# Patient Record
Sex: Male | Born: 1970 | Race: White | Hispanic: No | Marital: Married | Smoking: Never smoker
Health system: Southern US, Community
[De-identification: ages and names within clinical notes are randomized; demographics above are authoritative.]

## PROBLEM LIST (undated history)

## (undated) DIAGNOSIS — K219 Gastro-esophageal reflux disease without esophagitis: Secondary | ICD-10-CM

## (undated) DIAGNOSIS — K8020 Calculus of gallbladder without cholecystitis without obstruction: Secondary | ICD-10-CM

## (undated) HISTORY — PX: VASECTOMY: SHX75

## (undated) HISTORY — PX: WISDOM TOOTH EXTRACTION: SHX21

---

## 2009-10-26 ENCOUNTER — Emergency Department: Payer: Self-pay | Admitting: Emergency Medicine

## 2009-11-07 ENCOUNTER — Ambulatory Visit: Payer: Self-pay | Admitting: Family Medicine

## 2014-06-24 DIAGNOSIS — E669 Obesity, unspecified: Secondary | ICD-10-CM | POA: Insufficient documentation

## 2016-04-11 ENCOUNTER — Other Ambulatory Visit: Payer: Self-pay | Admitting: Family Medicine

## 2016-04-12 LAB — CMP12+LP+TP+TSH+6AC+PSA+CBC…
ALBUMIN: 4.5 g/dL (ref 3.5–5.5)
ALK PHOS: 65 IU/L (ref 39–117)
ALT: 40 IU/L (ref 0–44)
AST: 22 IU/L (ref 0–40)
Albumin/Globulin Ratio: 1.6 (ref 1.2–2.2)
BASOS: 0 %
BILIRUBIN TOTAL: 0.9 mg/dL (ref 0.0–1.2)
BUN/Creatinine Ratio: 14 (ref 9–20)
BUN: 13 mg/dL (ref 6–24)
Basophils Absolute: 0 10*3/uL (ref 0.0–0.2)
CHLORIDE: 103 mmol/L (ref 96–106)
Calcium: 9.6 mg/dL (ref 8.7–10.2)
Chol/HDL Ratio: 3.6 ratio units (ref 0.0–5.0)
Cholesterol, Total: 189 mg/dL (ref 100–199)
Creatinine, Ser: 0.91 mg/dL (ref 0.76–1.27)
EOS (ABSOLUTE): 0.3 10*3/uL (ref 0.0–0.4)
Eos: 4 %
Estimated CHD Risk: 0.6 times avg. (ref 0.0–1.0)
FREE THYROXINE INDEX: 2.2 (ref 1.2–4.9)
GFR calc Af Amer: 117 mL/min/{1.73_m2} (ref >59)
GFR calc non Af Amer: 101 mL/min/{1.73_m2} (ref >59)
GGT: 37 IU/L (ref 0–65)
GLOBULIN, TOTAL: 2.9 g/dL (ref 1.5–4.5)
Glucose: 106 mg/dL — ABNORMAL HIGH (ref 65–99)
HDL: 52 mg/dL (ref >39)
HEMATOCRIT: 45.2 % (ref 37.5–51.0)
Hemoglobin: 15.8 g/dL (ref 12.6–17.7)
IMMATURE GRANULOCYTES: 0 %
IRON: 78 ug/dL (ref 38–169)
Immature Grans (Abs): 0 10*3/uL (ref 0.0–0.1)
LDH: 184 IU/L (ref 121–224)
LDL CALC: 111 mg/dL — AB (ref 0–99)
LYMPHS ABS: 2.2 10*3/uL (ref 0.7–3.1)
Lymphs: 34 %
MCH: 30.3 pg (ref 26.6–33.0)
MCHC: 35 g/dL (ref 31.5–35.7)
MCV: 87 fL (ref 79–97)
MONOS ABS: 0.6 10*3/uL (ref 0.1–0.9)
Monocytes: 9 %
NEUTROS ABS: 3.4 10*3/uL (ref 1.4–7.0)
NEUTROS PCT: 53 %
PLATELETS: 291 10*3/uL (ref 150–379)
PROSTATE SPECIFIC AG, SERUM: 0.3 ng/mL (ref 0.0–4.0)
Phosphorus: 3.3 mg/dL (ref 2.5–4.5)
Potassium: 4.2 mmol/L (ref 3.5–5.2)
RBC: 5.21 x10E6/uL (ref 4.14–5.80)
RDW: 14.5 % (ref 12.3–15.4)
SODIUM: 143 mmol/L (ref 134–144)
T3 Uptake Ratio: 28 % (ref 24–39)
T4 TOTAL: 8 ug/dL (ref 4.5–12.0)
TSH: 4.17 u[IU]/mL (ref 0.450–4.500)
Total Protein: 7.4 g/dL (ref 6.0–8.5)
Triglycerides: 130 mg/dL (ref 0–149)
URIC ACID: 6.5 mg/dL (ref 3.7–8.6)
VLDL CHOLESTEROL CAL: 26 mg/dL (ref 5–40)
WBC: 6.4 10*3/uL (ref 3.4–10.8)

## 2016-04-12 LAB — HEPATITIS B SURFACE ANTIBODY,QUALITATIVE: HEP B SURFACE AB, QUAL: NONREACTIVE

## 2016-06-06 ENCOUNTER — Ambulatory Visit: Payer: Self-pay | Admitting: Family

## 2016-06-06 VITALS — BP 145/84 | HR 75 | Temp 98.3°F

## 2016-06-06 DIAGNOSIS — J069 Acute upper respiratory infection, unspecified: Secondary | ICD-10-CM

## 2016-06-06 DIAGNOSIS — J0120 Acute ethmoidal sinusitis, unspecified: Secondary | ICD-10-CM

## 2016-06-06 MED ORDER — PREDNISONE 10 MG (21) PO TBPK: 30.0000 mg | ORAL_TABLET | Freq: Every day | ORAL | 0 refills | Status: DC

## 2016-06-06 MED ORDER — PREDNISONE 10 MG PO TABS: 30.0000 mg | ORAL_TABLET | Freq: Every day | ORAL | 0 refills | Status: DC

## 2016-06-06 MED ORDER — AMOXICILLIN 875 MG PO TABS: 875.0000 mg | ORAL_TABLET | Freq: Two times a day (BID) | ORAL | 0 refills | Status: DC

## 2016-06-06 NOTE — Progress Notes (Signed)
S/ one week hx of  Nasal congestion, ear pain and pressure, lightheaded at times ,  cough , low grade fever ,  Taking muccinex   PMHx - neg Surgical hx vasectomy  O/  Mildly ill appearing  NAD  ENT tms are red ,dull and retracted bilaterally , nasal mucosa very red ,swollen with purulent rhinorhea,  , throat injected and much PND ,  Neck supple nontender heart rsr lungs clear  A/ URI/ sinusitis / cough P supportive measure , amoxicillan , pred pulse x 3 days for ear sx . Per request note for rest at home today and tomorrow. F/u prn not improving.

## 2016-07-11 ENCOUNTER — Ambulatory Visit: Payer: Self-pay | Admitting: Physician Assistant

## 2016-07-11 ENCOUNTER — Encounter: Payer: Self-pay | Admitting: Physician Assistant

## 2016-07-11 VITALS — BP 140/80 | HR 91 | Temp 98.3°F

## 2016-07-11 DIAGNOSIS — L918 Other hypertrophic disorders of the skin: Secondary | ICD-10-CM

## 2016-07-11 NOTE — Progress Notes (Signed)
S: c/o skin tag bleeding in r axilla, has several tags , this one got caught on his shirt and started to bleed, no other complaints  O: vitals wnl, nad, skin tags in r axilla, one very large tag with irritation, n/v intact, cleaned area with betadine, lidocaine local, #11 blade for skin tag, removal, cauterized small area but area still oozing, neosporin and bandage applied, pt tolerated procedure well  A: skin tag removal  P: f/u prn, if bleeding persists return to clinic

## 2016-10-08 ENCOUNTER — Ambulatory Visit
Admission: RE | Admit: 2016-10-08 | Discharge: 2016-10-08 | Disposition: A | Discharge status: Home / Self Care | Payer: Worker's Compensation | Source: Ambulatory Visit | Attending: Specialist | Admitting: Specialist

## 2016-10-08 ENCOUNTER — Other Ambulatory Visit: Payer: Self-pay | Admitting: Specialist

## 2016-10-08 ENCOUNTER — Ambulatory Visit
Admission: RE | Admit: 2016-10-08 | Discharge: 2016-10-08 | Disposition: A | Discharge status: Home / Self Care | Payer: Self-pay | Source: Ambulatory Visit | Attending: Specialist | Admitting: Specialist

## 2016-10-08 DIAGNOSIS — Z0389 Encounter for observation for other suspected diseases and conditions ruled out: Secondary | ICD-10-CM | POA: Insufficient documentation

## 2016-10-08 DIAGNOSIS — T1590XA Foreign body on external eye, part unspecified, unspecified eye, initial encounter: Secondary | ICD-10-CM

## 2017-02-15 ENCOUNTER — Ambulatory Visit: Payer: Self-pay | Admitting: Physician Assistant

## 2017-02-15 ENCOUNTER — Encounter: Payer: Self-pay | Admitting: Physician Assistant

## 2017-02-15 VITALS — BP 116/80 | HR 72 | Temp 97.8°F

## 2017-02-15 DIAGNOSIS — Z0000 Encounter for general adult medical examination without abnormal findings: Secondary | ICD-10-CM

## 2017-02-15 NOTE — Progress Notes (Signed)
S: pt here for wellness physical had biometrics for insurance purposes done at work, was told his good cholesterol is low and he is overweight, has started doing a herbal life diet; no other complaints ros neg. PMH:    Social:  Fam:  O: vitals wnl, nad, ENT wnl, neck supple no lymph, lungs c t a, cv rrr, abd soft nontender bs normal all 4 quads  A: wellness physical  P: return in July for repeat physical and biometrics

## 2017-05-13 ENCOUNTER — Encounter: Payer: Self-pay | Admitting: Physician Assistant

## 2017-05-13 ENCOUNTER — Ambulatory Visit
Admission: RE | Admit: 2017-05-13 | Discharge: 2017-05-13 | Disposition: A | Discharge status: Home / Self Care | Payer: Managed Care, Other (non HMO) | Source: Ambulatory Visit | Attending: Physician Assistant | Admitting: Physician Assistant

## 2017-05-13 ENCOUNTER — Ambulatory Visit: Payer: Self-pay | Admitting: Physician Assistant

## 2017-05-13 VITALS — BP 110/86 | HR 73 | Temp 98.1°F

## 2017-05-13 DIAGNOSIS — R1011 Right upper quadrant pain: Secondary | ICD-10-CM | POA: Insufficient documentation

## 2017-05-13 DIAGNOSIS — K8020 Calculus of gallbladder without cholecystitis without obstruction: Secondary | ICD-10-CM | POA: Diagnosis not present

## 2017-05-13 DIAGNOSIS — K760 Fatty (change of) liver, not elsewhere classified: Secondary | ICD-10-CM | POA: Diagnosis not present

## 2017-05-13 NOTE — Progress Notes (Signed)
S: c/o severe ruq pain for last 3 days, multiple episodes of diarrhea, had black stools yesterday but did take pepto bismol, some fever/chills, no vomiting, but did have nausea on 1st day, has been doing high protein diet for weight loss by using herbal life, strong fam hx of gallbladder disease  O: vitals wnl nad, lungs c t a, cv rrr, abd soft extremely tender in ruq, pt guarding during palpation, bs hyper in both lower quadrants, n/v ntact, pt does not appear toxic  A: acute ruq pain  P: labs, stat US of ruq

## 2017-05-14 LAB — CBC WITH DIFFERENTIAL/PLATELET
BASOS ABS: 0 10*3/uL (ref 0.0–0.2)
Basos: 0 %
EOS (ABSOLUTE): 0.2 10*3/uL (ref 0.0–0.4)
Eos: 3 %
Hematocrit: 49.5 % (ref 37.5–51.0)
Hemoglobin: 16.6 g/dL (ref 13.0–17.7)
Immature Grans (Abs): 0 10*3/uL (ref 0.0–0.1)
Immature Granulocytes: 0 %
LYMPHS ABS: 1.8 10*3/uL (ref 0.7–3.1)
Lymphs: 26 %
MCH: 29 pg (ref 26.6–33.0)
MCHC: 33.5 g/dL (ref 31.5–35.7)
MCV: 86 fL (ref 79–97)
MONOCYTES: 12 %
MONOS ABS: 0.8 10*3/uL (ref 0.1–0.9)
NEUTROS ABS: 4.1 10*3/uL (ref 1.4–7.0)
Neutrophils: 59 %
PLATELETS: 298 10*3/uL (ref 150–379)
RBC: 5.73 x10E6/uL (ref 4.14–5.80)
RDW: 14 % (ref 12.3–15.4)
WBC: 7 10*3/uL (ref 3.4–10.8)

## 2017-05-14 LAB — COMPREHENSIVE METABOLIC PANEL
ALK PHOS: 75 IU/L (ref 39–117)
ALT: 49 IU/L — ABNORMAL HIGH (ref 0–44)
AST: 36 IU/L (ref 0–40)
Albumin/Globulin Ratio: 1.7 (ref 1.2–2.2)
Albumin: 4.7 g/dL (ref 3.5–5.5)
BILIRUBIN TOTAL: 1.4 mg/dL — AB (ref 0.0–1.2)
BUN/Creatinine Ratio: 10 (ref 9–20)
BUN: 10 mg/dL (ref 6–24)
CO2: 19 mmol/L — AB (ref 20–29)
CREATININE: 1.04 mg/dL (ref 0.76–1.27)
Calcium: 10 mg/dL (ref 8.7–10.2)
Chloride: 101 mmol/L (ref 96–106)
GFR calc Af Amer: 99 mL/min/{1.73_m2} (ref >59)
GFR calc non Af Amer: 86 mL/min/{1.73_m2} (ref >59)
GLUCOSE: 100 mg/dL — AB (ref 65–99)
Globulin, Total: 2.8 g/dL (ref 1.5–4.5)
Potassium: 4.7 mmol/L (ref 3.5–5.2)
Sodium: 140 mmol/L (ref 134–144)
Total Protein: 7.5 g/dL (ref 6.0–8.5)

## 2017-05-14 LAB — LIPASE: LIPASE: 30 U/L (ref 13–78)

## 2017-05-14 NOTE — Progress Notes (Signed)
Contacted scheduling dept. Appointment for U/S of Abd. scheduled today @ 11:00 patient was informed.

## 2017-09-24 ENCOUNTER — Encounter: Payer: Self-pay | Admitting: Emergency Medicine

## 2017-09-24 ENCOUNTER — Other Ambulatory Visit: Payer: Self-pay

## 2017-09-24 DIAGNOSIS — K8012 Calculus of gallbladder with acute and chronic cholecystitis without obstruction: Principal | ICD-10-CM | POA: Insufficient documentation

## 2017-09-24 DIAGNOSIS — K219 Gastro-esophageal reflux disease without esophagitis: Secondary | ICD-10-CM | POA: Diagnosis not present

## 2017-09-24 DIAGNOSIS — K819 Cholecystitis, unspecified: Secondary | ICD-10-CM | POA: Diagnosis present

## 2017-09-24 DIAGNOSIS — R945 Abnormal results of liver function studies: Secondary | ICD-10-CM | POA: Diagnosis not present

## 2017-09-24 LAB — CBC
HCT: 47.5 % (ref 40.0–52.0)
HEMOGLOBIN: 16.1 g/dL (ref 13.0–18.0)
MCH: 29.6 pg (ref 26.0–34.0)
MCHC: 33.9 g/dL (ref 32.0–36.0)
MCV: 87.4 fL (ref 80.0–100.0)
Platelets: 288 10*3/uL (ref 150–440)
RBC: 5.44 MIL/uL (ref 4.40–5.90)
RDW: 13.4 % (ref 11.5–14.5)
WBC: 9 10*3/uL (ref 3.8–10.6)

## 2017-09-24 NOTE — ED Triage Notes (Signed)
Patient ambulatory to triage with steady gait, without difficulty or distress noted; pt reports right upper abd pain radiating across upper abd and into back accomp by N/V; st hx of gallbladder issues

## 2017-09-25 ENCOUNTER — Other Ambulatory Visit: Payer: Self-pay

## 2017-09-25 ENCOUNTER — Encounter: Payer: Self-pay | Admitting: Emergency Medicine

## 2017-09-25 ENCOUNTER — Observation Stay
Admission: EM | Admit: 2017-09-25 | Discharge: 2017-09-28 | Disposition: A | Discharge status: Home / Self Care | Payer: Managed Care, Other (non HMO) | Attending: Surgery | Admitting: Surgery

## 2017-09-25 ENCOUNTER — Emergency Department: Payer: Managed Care, Other (non HMO)

## 2017-09-25 DIAGNOSIS — K819 Cholecystitis, unspecified: Secondary | ICD-10-CM

## 2017-09-25 DIAGNOSIS — Z419 Encounter for procedure for purposes other than remedying health state, unspecified: Secondary | ICD-10-CM

## 2017-09-25 HISTORY — DX: Gastro-esophageal reflux disease without esophagitis: K21.9

## 2017-09-25 HISTORY — DX: Calculus of gallbladder without cholecystitis without obstruction: K80.20

## 2017-09-25 LAB — COMPREHENSIVE METABOLIC PANEL
ALBUMIN: 4.2 g/dL (ref 3.5–5.0)
ALBUMIN: 4.5 g/dL (ref 3.5–5.0)
ALK PHOS: 50 U/L (ref 38–126)
ALK PHOS: 61 U/L (ref 38–126)
ALT: 34 U/L (ref 17–63)
ALT: 37 U/L (ref 17–63)
ANION GAP: 6 (ref 5–15)
ANION GAP: 7 (ref 5–15)
AST: 25 U/L (ref 15–41)
AST: 33 U/L (ref 15–41)
BILIRUBIN TOTAL: 1.1 mg/dL (ref 0.3–1.2)
BUN: 13 mg/dL (ref 6–20)
BUN: 16 mg/dL (ref 6–20)
CALCIUM: 8.6 mg/dL — AB (ref 8.9–10.3)
CALCIUM: 9.3 mg/dL (ref 8.9–10.3)
CHLORIDE: 105 mmol/L (ref 101–111)
CO2: 24 mmol/L (ref 22–32)
CO2: 25 mmol/L (ref 22–32)
Chloride: 107 mmol/L (ref 101–111)
Creatinine, Ser: 0.81 mg/dL (ref 0.61–1.24)
Creatinine, Ser: 1 mg/dL (ref 0.61–1.24)
GFR calc Af Amer: >60 mL/min (ref >60)
GFR calc Af Amer: >60 mL/min (ref >60)
GFR calc non Af Amer: >60 mL/min (ref >60)
GFR calc non Af Amer: >60 mL/min (ref >60)
GLUCOSE: 99 mg/dL (ref 65–99)
GLUCOSE: 105 mg/dL — AB (ref 65–99)
POTASSIUM: 3.7 mmol/L (ref 3.5–5.1)
Potassium: 4.2 mmol/L (ref 3.5–5.1)
SODIUM: 137 mmol/L (ref 135–145)
SODIUM: 137 mmol/L (ref 135–145)
TOTAL PROTEIN: 7.1 g/dL (ref 6.5–8.1)
Total Bilirubin: 1.3 mg/dL — ABNORMAL HIGH (ref 0.3–1.2)
Total Protein: 7.9 g/dL (ref 6.5–8.1)

## 2017-09-25 LAB — MRSA PCR SCREENING: MRSA by PCR: NEGATIVE

## 2017-09-25 LAB — URINALYSIS, COMPLETE (UACMP) WITH MICROSCOPIC
BACTERIA UA: NONE SEEN
Bilirubin Urine: NEGATIVE
GLUCOSE, UA: NEGATIVE mg/dL
Hgb urine dipstick: NEGATIVE
KETONES UR: NEGATIVE mg/dL
Leukocytes, UA: NEGATIVE
Nitrite: NEGATIVE
PROTEIN: 30 mg/dL — AB
Specific Gravity, Urine: 1.024 (ref 1.005–1.030)
pH: 5 (ref 5.0–8.0)

## 2017-09-25 LAB — CBC
HCT: 43 % (ref 40.0–52.0)
Hemoglobin: 14.9 g/dL (ref 13.0–18.0)
MCH: 30.4 pg (ref 26.0–34.0)
MCHC: 34.6 g/dL (ref 32.0–36.0)
MCV: 87.9 fL (ref 80.0–100.0)
PLATELETS: 254 10*3/uL (ref 150–440)
RBC: 4.89 MIL/uL (ref 4.40–5.90)
RDW: 13.7 % (ref 11.5–14.5)
WBC: 10.6 10*3/uL (ref 3.8–10.6)

## 2017-09-25 LAB — LIPASE, BLOOD: LIPASE: 34 U/L (ref 11–51)

## 2017-09-25 LAB — TROPONIN I

## 2017-09-25 MED ORDER — ONDANSETRON HCL 4 MG/2ML IJ SOLN
4.0000 mg | Freq: Four times a day (QID) | INTRAMUSCULAR | Status: DC | PRN
  Administered 2017-09-26: 4 mg via INTRAVENOUS
  Filled 2017-09-25: qty 2

## 2017-09-25 MED ORDER — PIPERACILLIN-TAZOBACTAM 3.375 G IVPB
3.3750 g | Freq: Three times a day (TID) | INTRAVENOUS | Status: DC
  Administered 2017-09-25 – 2017-09-26 (×5): 3.375 g via INTRAVENOUS
  Filled 2017-09-25 (×4): qty 50

## 2017-09-25 MED ORDER — PANTOPRAZOLE SODIUM 40 MG IV SOLR
40.0000 mg | Freq: Every day | INTRAVENOUS | Status: DC
  Administered 2017-09-25 – 2017-09-27 (×3): 40 mg via INTRAVENOUS
  Filled 2017-09-25 (×3): qty 40

## 2017-09-25 MED ORDER — MORPHINE SULFATE (PF) 4 MG/ML IV SOLN
4.0000 mg | INTRAVENOUS | Status: DC | PRN
  Administered 2017-09-26: 4 mg via INTRAVENOUS
  Filled 2017-09-25: qty 1

## 2017-09-25 MED ORDER — ONDANSETRON HCL 4 MG/2ML IJ SOLN
4.0000 mg | INTRAMUSCULAR | Status: AC
  Administered 2017-09-25: 4 mg via INTRAVENOUS
  Filled 2017-09-25: qty 2

## 2017-09-25 MED ORDER — SODIUM CHLORIDE 0.9 % IV BOLUS (SEPSIS)
1000.0000 mL | INTRAVENOUS | Status: AC
  Administered 2017-09-25: 1000 mL via INTRAVENOUS

## 2017-09-25 MED ORDER — ENOXAPARIN SODIUM 40 MG/0.4ML ~~LOC~~ SOLN
40.0000 mg | SUBCUTANEOUS | Status: DC
  Administered 2017-09-26 – 2017-09-28 (×3): 40 mg via SUBCUTANEOUS
  Filled 2017-09-25 (×3): qty 0.4

## 2017-09-25 MED ORDER — FENTANYL CITRATE (PF) 250 MCG/5ML IJ SOLN
INTRAMUSCULAR | Status: AC
  Filled 2017-09-25: qty 5

## 2017-09-25 MED ORDER — MORPHINE SULFATE (PF) 4 MG/ML IV SOLN
4.0000 mg | Freq: Once | INTRAVENOUS | Status: AC
  Administered 2017-09-25: 4 mg via INTRAVENOUS
  Filled 2017-09-25: qty 1

## 2017-09-25 MED ORDER — LACTATED RINGERS IV SOLN
INTRAVENOUS | Status: DC
  Administered 2017-09-25 – 2017-09-28 (×7): via INTRAVENOUS

## 2017-09-25 MED ORDER — SODIUM CHLORIDE 0.9 % IV BOLUS (SEPSIS): 1000.0000 mL | INTRAVENOUS | Status: AC

## 2017-09-25 MED ORDER — MIDAZOLAM HCL 2 MG/2ML IJ SOLN
INTRAMUSCULAR | Status: AC
  Filled 2017-09-25: qty 2

## 2017-09-25 MED ORDER — ONDANSETRON 4 MG PO TBDP
4.0000 mg | ORAL_TABLET | Freq: Four times a day (QID) | ORAL | Status: DC | PRN
Start: 2017-09-25 — End: 2017-09-28

## 2017-09-25 MED ORDER — KETOROLAC TROMETHAMINE 30 MG/ML IJ SOLN
30.0000 mg | Freq: Four times a day (QID) | INTRAMUSCULAR | Status: DC | PRN
  Administered 2017-09-25 – 2017-09-27 (×5): 30 mg via INTRAVENOUS
  Filled 2017-09-25 (×5): qty 1

## 2017-09-25 NOTE — ED Provider Notes (Signed)
[facility] Emergency Department Provider Note  ____________________________________________   First MD Initiated Contact with Patient 09/25/17 0255     (approximate)  I have reviewed the triage vital signs and the nursing notes.   HISTORY  Chief Complaint Abdominal Pain    HPI Jerry Johns is a 46 y.o. male with a known history of gallstones who presents for evaluation of acute onset severe pain in his epigastrium and right upper quadrant similar but worse than prior episodes of biliary colic.  He reports that he was diagnosed by ultrasound at least 6 months ago and he is used to intermittent episodes of pain and occasionally of nausea after he eats.  He has intentionally lost more than 30 pounds and has been feeling more healthy than before but continues to have biliary colic from time to time.  He states that he awoke this morning feeling uncomfortable in the epigastrium and right upper quadrant and the pain gradually got worse over the course of the day.  It is the worst she has ever experienced, severe, sharp and stabbing as well as aching and radiating from the front to the back.  He cannot find a position of comfort.  He has vomited numerous times and is not been able to eat or drink anything today.  He has felt subjectively febrile and had some chills but he has not had a fever when he has checked with a thermometer.  He denies chest pain, dysuria, diarrhea, and constipation.  Past Medical History:  Diagnosis Date  . Gallstones summer 2018   diagnosed, but as of Dec 2018, no surgical intervention    There are no active problems to display for this patient.   Past Surgical History:  Procedure Laterality Date  . VASECTOMY      Prior to Admission medications   Medication Sig Start Date End Date Taking? Authorizing Provider  ibuprofen (ADVIL,MOTRIN) 800 MG tablet Take 800 mg by mouth every 6 (six) hours as needed for fever or moderate pain.   07/31/12  Yes [provider]  amoxicillin (AMOXIL) 875 MG tablet Take 1 tablet (875 mg total) by mouth 2 (two) times daily. Patient not taking: Reported on 07/11/2016 06/06/16   Francesco SorMoore, Tommie Anne, NP  predniSONE (DELTASONE) 10 MG tablet Take 3 tablets (30 mg total) by mouth daily with breakfast. Patient not taking: Reported on 07/11/2016 06/06/16   Francesco SorMoore, Tommie Anne, NP    Allergies Patient has no known allergies.  Family History  Problem Relation Age of Onset  . Diabetes Father   . Cancer Father        esophageal  . Diabetes Maternal Grandmother   . Diabetes Maternal Grandfather   . Diabetes Paternal Grandmother   . Diabetes Paternal Grandfather     Social History Social History   Tobacco Use  . Smoking status: Never Smoker  . Smokeless tobacco: Never Used  Substance Use Topics  . Alcohol use: Yes    Alcohol/week: 1.2 oz    Types: 2 Cans of beer per week  . Drug use: No    Review of Systems Constitutional: Subjective fever/chills Cardiovascular: Denies chest pain. Respiratory: Denies shortness of breath. Gastrointestinal: Severe epigastric and right upper quadrant pain that radiates to the back with associated nausea and multiple episodes of emesis Genitourinary: Negative for dysuria. Musculoskeletal: Pain radiating from his epigastrium to his back, no other musculoskeletal issues Integumentary: Negative for rash. Neurological: Negative for headaches, focal weakness or numbness.   ____________________________________________  PHYSICAL EXAM:  VITAL SIGNS: ED Triage Vitals  Enc Vitals Group     BP 09/24/17 2327 (!) 153/88     Pulse Rate 09/24/17 2327 60     Resp 09/24/17 2327 20     Temp 09/24/17 2327 98.4 F (36.9 C)     Temp Source 09/24/17 2327 Oral     SpO2 09/24/17 2327 98 %     Weight 09/24/17 2326 117.9 kg (260 lb)     Height 09/24/17 2326 1.778 m (5\' 10" )     Head Circumference --      Peak Flow --      Pain Score 09/24/17 2325 6      Pain Loc --      Pain Edu? --      Excl. in GC? --     Constitutional: Alert and oriented.  No acute distress but does appear uncomfortable Eyes: Conjunctivae are normal.  Cardiovascular: Normal rate, regular rhythm. Good peripheral circulation. Grossly normal heart sounds. Respiratory: Normal respiratory effort.  No retractions. Lungs CTAB. Gastrointestinal: Soft with severe epigastric and right upper quadrant tenderness to palpation with guarding and positive Murphy sign.  Patient guards somewhat with lower abdominal tenderness but he has no focal tenderness to the lower abdomen. Musculoskeletal: No lower extremity tenderness nor edema. No gross deformities of extremities. Neurologic:  Normal speech and language. No gross focal neurologic deficits are appreciated.  Skin:  Skin is warm, dry and intact. No rash noted. Psychiatric: Mood and affect are normal. Speech and behavior are normal.  ____________________________________________   LABS (all labs ordered are listed, but only abnormal results are displayed)  Labs Reviewed  COMPREHENSIVE METABOLIC PANEL - Abnormal; Notable for the following components:      Result Value   Glucose, Bld 105 (*)    Total Bilirubin 1.3 (*)    All other components within normal limits  URINALYSIS, COMPLETE (UACMP) WITH MICROSCOPIC - Abnormal; Notable for the following components:   Color, Urine YELLOW (*)    APPearance CLEAR (*)    Protein, ur 30 (*)    Squamous Epithelial / LPF 0-5 (*)    All other components within normal limits  LIPASE, BLOOD  CBC  TROPONIN I   ____________________________________________  EKG  None - EKG not ordered by ED physician ____________________________________________  RADIOLOGY   US Abdomen Limited Ruq  Result Date: 09/25/2017 CLINICAL DATA:  Acute onset of severe epigastric and right upper quadrant abdominal pain. EXAM: ULTRASOUND ABDOMEN LIMITED RIGHT UPPER QUADRANT COMPARISON:  Right upper quadrant  ultrasound performed 05/13/2017 FINDINGS: Gallbladder: A 1.3 cm stone is lodged at the neck of the gallbladder. Mild gallbladder wall thickening is noted. No pericholecystic fluid is seen. A positive ultrasonographic Murphy's sign is elicited. Common bile duct: Diameter: 0.3 cm, within normal limits in caliber. Liver: No focal lesion identified. Within normal limits in parenchymal echogenicity. Portal vein is patent on color Doppler imaging with normal direction of blood flow towards the liver. IMPRESSION: 1.3 cm stone lodged at the neck of the gallbladder. Mild gallbladder wall thickening, with a positive ultrasonographic Murphy's sign. This raises concern for mild acute cholecystitis. Electronically Signed   By: Roanna Raider M.D.   On: 09/25/2017 04:36    ____________________________________________   PROCEDURES  Critical Care performed: No   Procedure(s) performed:   Procedures   ____________________________________________   INITIAL IMPRESSION / ASSESSMENT AND PLAN / ED COURSE  As part of my medical decision making, I reviewed the following data within the  electronic MEDICAL RECORD NUMBER Nursing notes reviewed and incorporated, Labs reviewed , Discussed with admitting physician (Dr. Everlene FarrierPabon) and A consult was requested and obtained from this/these consultant(s) Surgery (Dr. Everlene FarrierPabon)    Differential diagnosis includes, but is not limited to, biliary disease (biliary colic, acute cholecystitis, cholangitis, choledocholithiasis, etc), intrathoracic causes for epigastric abdominal pain including ACS, gastritis, duodenitis, pancreatitis, small bowel or large bowel obstruction, abdominal aortic aneurysm, hernia, and gastritis.  Based on the patient's HPI and medical history, I strongly suspect biliary colic, possibly choledocholithiasis.  I doubt cholecystitis unless it is early given that the patient has normal lab work without any elevation of LFTs and no elevated bilirubin.  His vital signs are  stable and he is afebrile and I do not suspect ascending cholangitis at this time.  I will provide morphine and Zofran and obtain an ultrasound for further evaluation to determine the appropriate disposition.  Clinical Course as of Sep 25 526  Wed Sep 25, 2017  [id number] Ultrasound is suggestive of mild cholecystitis and clinically he certainly has severe biliary colic with a nonmobile stone.  His pain has improved but is severe with palpation.  I discussed the case by phone with Dr. Everlene FarrierPabon with general surgery who will admit the patient.  I updated the patient and his family. US ABDOMEN LIMITED RUQ [CF]    Clinical Course User Index [CF] Loleta RoseForbach, Crispin Vogel, MD    ____________________________________________  FINAL CLINICAL IMPRESSION(S) / ED DIAGNOSES  Final diagnoses:  Cholecystitis     MEDICATIONS GIVEN DURING THIS VISIT:  Medications  sodium chloride 0.9 % bolus 1,000 mL (not administered)  morphine 4 MG/ML injection 4 mg (4 mg Intravenous Given 09/25/17 0333)  ondansetron (ZOFRAN) injection 4 mg (4 mg Intravenous Given 09/25/17 0333)  sodium chloride 0.9 % bolus 1,000 mL (1,000 mLs Intravenous New Bag/Given 09/25/17 [id number])     ED Discharge Orders    None       Note:  This document was prepared using Dragon voice recognition software and may include unintentional dictation errors.    Loleta RoseForbach, Cordero Surette, MD 09/25/17 [phone]

## 2017-09-25 NOTE — Progress Notes (Signed)
Phone and charger returned to patient from ED. Windy Carinaurner,Annah Jasko K, RN7:32 AM 09/25/2017

## 2017-09-25 NOTE — H&P (Signed)
Patient ID: Jerry SawyersJohn L Smet, male   DOB: 12/05/1970, 46 y.o.   MRN: [id number]  HPI Jerry Johns is a 46 y.o. male with a 24-hour history of right upper quadrant pain. He reports the pain is moderate to severe, sharp and now constant for the last few hours. Nonspecific out of eating or aggravating factors. Of note he has had some milder beery colic attacks over the last year or so. He has known days got gallstones for the last 8 months or so. No evidence of biliary obstruction, no fevers or chills no evidence of jaundice. Ultrasound personally reviewed showing evidence of mild thickening of the gallbladder with stone, normal common bile duct. White count and LFTs are normal he stimulates very slightly elevated. Is also stone stuck in the neck of the gallbladder. Is able to perform more than 4 Mets without any shortness of breath or chest pain  HPI  Past Medical History:  Diagnosis Date  . Gallstones summer 2018   diagnosed, but as of Dec 2018, no surgical intervention    Past Surgical History:  Procedure Laterality Date  . VASECTOMY      Family History  Problem Relation Age of Onset  . Diabetes Father   . Cancer Father        esophageal  . Diabetes Maternal Grandmother   . Diabetes Maternal Grandfather   . Diabetes Paternal Grandmother   . Diabetes Paternal Grandfather     Social History Social History   Tobacco Use  . Smoking status: Never Smoker  . Smokeless tobacco: Never Used  Substance Use Topics  . Alcohol use: Yes    Alcohol/week: 1.2 oz    Types: 2 Cans of beer per week  . Drug use: No    No Known Allergies  Current Facility-Administered Medications  Medication Dose Route Frequency Provider Last Rate Last Dose  . enoxaparin (LOVENOX) injection 40 mg  40 mg Subcutaneous Q24H Pabon, Diego F, MD      . ketorolac (TORADOL) 30 MG/ML injection 30 mg  30 mg Intravenous Q6H PRN Pabon, Diego F, MD      . lactated ringers infusion   Intravenous Continuous Pabon, Diego F, MD       . morphine 4 MG/ML injection 4 mg  4 mg Intravenous Q3H PRN Pabon, Diego F, MD      . ondansetron (ZOFRAN-ODT) disintegrating tablet 4 mg  4 mg Oral Q6H PRN Pabon, Diego F, MD       Or  . ondansetron (ZOFRAN) injection 4 mg  4 mg Intravenous Q6H PRN Pabon, Diego F, MD      . pantoprazole (PROTONIX) injection 40 mg  40 mg Intravenous QHS Pabon, Diego F, MD      . piperacillin-tazobactam (ZOSYN) IVPB 3.375 g  3.375 g Intravenous Q8H Pabon, Diego F, MD      . sodium chloride 0.9 % bolus 1,000 mL  1,000 mL Intravenous STAT Pabon, Merri Rayiego F, MD       Current Outpatient Medications  Medication Sig Dispense Refill  . ibuprofen (ADVIL,MOTRIN) 800 MG tablet Take 800 mg by mouth every 6 (six) hours as needed for fever or moderate pain.     Marland Kitchen. amoxicillin (AMOXIL) 875 MG tablet Take 1 tablet (875 mg total) by mouth 2 (two) times daily. (Patient not taking: Reported on 07/11/2016) 20 tablet 0  . predniSONE (DELTASONE) 10 MG tablet Take 3 tablets (30 mg total) by mouth daily with breakfast. (Patient not taking: Reported on 07/11/2016) 9  tablet 0     Review of Systems Full ROS  was asked and was negative except for the information on the HPI  Physical Exam Blood pressure 134/85, pulse 79, temperature 98.4 F (36.9 C), temperature source Oral, resp. rate 18, height 5\' 10"  (1.778 m), weight 117.9 kg (260 lb), SpO2 (!) 81 %. CONSTITUTIONAL: Nad EYES: Pupils are equal, round, and reactive to light, Sclera are non-icteric. EARS, NOSE, MOUTH AND THROAT: The oropharynx is clear. The oral mucosa is pink and moist. Hearing is intact to voice. LYMPH NODES:  Lymph nodes in the neck are normal. RESPIRATORY:  Lungs are clear. There is normal respiratory effort, with equal breath sounds bilaterally, and without pathologic use of accessory muscles. CARDIOVASCULAR: Heart is regular without murmurs, gallops, or rubs. GI: The abdomen is  Soft,TTP RUQ + Murphy. GU: Rectal deferred.   MUSCULOSKELETAL: Normal muscle  strength and tone. No cyanosis or edema.   SKIN: Turgor is good and there are no pathologic skin lesions or ulcers. NEUROLOGIC: Motor and sensation is grossly normal. Cranial nerves are grossly intact. PSYCH:  Oriented to person, place and time. Affect is normal.  Data Reviewed  I have personally reviewed the patient's imaging, laboratory findings and medical records.    Assessment/Plan Acute cholecystitis. Discussed with the patient in detail about his disease process and I do recommend laparoscopic cholecystectomy.The risks, benefits, complications, treatment options, and expected outcomes were discussed with the patient. The possibilities of bleeding, recurrent infection, finding a normal gallbladder, perforation of viscus organs, damage to surrounding structures, bile leak, abscess formation, needing a drain placed, the need for additional procedures, reaction to medication, pulmonary aspiration,  failure to diagnose a condition, the possible need to convert to an open procedure, and creating a complication requiring transfusion or operation were discussed with the patient. The patient and/or family concurred with the proposed plan, giving informed consent.  We'll go ahead and admit him to the hospital place him nothing by mouth give him another liter of crystalloid and start broad-spectrum antibiotics. Dr. Tonita CongWoodham will be performing his cholecystectomy probably later today if not tomorrow, he understands and agrees Sterling Bigiego Pabon, MD [facility] General Surgeon 09/25/2017, 5:47 AM

## 2017-09-25 NOTE — Progress Notes (Signed)
  Patient seen and examined this morning.  Agree with the assessment of Dr. Everlene FarrierPabon.  I discussed the procedure of a laparoscopic cholecystectomy in detail.  We discussed the risks and benefits of a laparoscopic cholecystectomy and possible cholangiogram including, but not limited to bleeding, infection, injury to surrounding structures such as the intestine or liver, bile leak, retained gallstones, need to convert to an open procedure, prolonged diarrhea, blood clots such as  DVT, common bile duct injury, anesthesia risks, and possible need for additional procedures.  The likelihood of improvement in symptoms and return to the patient's normal status is good. We discussed the typical post-operative recovery course.  Patient voiced understanding and desires to proceed.  Discussed that he will be added on to the surgery schedule and we will proceed to the operating room later today.  Jerry Frameharles Shalynn Jorstad, MD [facility] General Surgeon [facility]  Day [facility] [phone](7a-7p) [phone] Night [facility] [phone](7p-7a) [phone]

## 2017-09-25 NOTE — Care Management Note (Signed)
Case Management Note  Patient Details  Name: Jerry SawyersJohn L Johns MRN: [id number] Date of Birth: 07/28/1971  Subjective/Objective:                 Placed in observation for cholecystitis.  Schedule for lap chole today.  At present, No discharge needs identified.    Action/Plan:   Expected Discharge Date:  09/27/17               Expected Discharge Plan:     In-House Referral:     Discharge planning Services     Post Acute Care Choice:    Choice offered to:     DME Arranged:    DME Agency:     HH Arranged:    HH Agency:     Status of Service:     If discussed at [employer] Meetings, dates discussed:    Additional Comments:  Eber HongGreene, Lacey Dotson R, RN 09/25/2017, 9:17 AM

## 2017-09-25 NOTE — Progress Notes (Signed)
Chaplain responded to an OR for advanced directive for a pt in Rm. 226. CH met pt and wife at bedside. CH tried to educated the pt on who to compete AD, but pt was too sleepy this AM. CH left AD material with pt and told pt that he will return to educated pt later when he is awake. Pt agreed.   09/25/17 0900  Clinical Encounter Type  Visited With Patient;Patient and family together  Visit Type Initial;Other (Comment)  Referral From Nurse  Consult/Referral To Chaplain  Spiritual Encounters  Spiritual Needs Literature;Brochure           

## 2017-09-25 NOTE — Plan of Care (Signed)
Pt to OR in am. Pain is controlled and is pt is without nausea.

## 2017-09-26 ENCOUNTER — Observation Stay: Payer: Managed Care, Other (non HMO) | Admitting: Anesthesiology

## 2017-09-26 ENCOUNTER — Observation Stay: Payer: Managed Care, Other (non HMO)

## 2017-09-26 ENCOUNTER — Encounter
Admission: EM | Disposition: A | Discharge status: Home / Self Care | Payer: Self-pay | Source: Home / Self Care | Attending: Emergency Medicine

## 2017-09-26 ENCOUNTER — Encounter: Payer: Self-pay | Admitting: *Deleted

## 2017-09-26 DIAGNOSIS — K819 Cholecystitis, unspecified: Secondary | ICD-10-CM | POA: Diagnosis not present

## 2017-09-26 HISTORY — PX: CHOLECYSTECTOMY: SHX55

## 2017-09-26 LAB — HIV ANTIBODY (ROUTINE TESTING W REFLEX): HIV SCREEN 4TH GENERATION: NONREACTIVE

## 2017-09-26 SURGERY — LAPAROSCOPIC CHOLECYSTECTOMY
Anesthesia: General | Wound class: Clean Contaminated

## 2017-09-26 MED ORDER — SUCCINYLCHOLINE CHLORIDE 20 MG/ML IJ SOLN
INTRAMUSCULAR | Status: DC | PRN
  Administered 2017-09-26: 120 mg via INTRAVENOUS

## 2017-09-26 MED ORDER — FENTANYL CITRATE (PF) 100 MCG/2ML IJ SOLN
INTRAMUSCULAR | Status: AC
  Administered 2017-09-26: 25 ug via INTRAVENOUS
  Filled 2017-09-26: qty 2

## 2017-09-26 MED ORDER — LIDOCAINE HCL (CARDIAC) 20 MG/ML IV SOLN
INTRAVENOUS | Status: DC | PRN
  Administered 2017-09-26: 100 mg via INTRAVENOUS

## 2017-09-26 MED ORDER — FAMOTIDINE 20 MG PO TABS
ORAL_TABLET | ORAL | Status: AC
  Administered 2017-09-26: 20 mg via ORAL
  Filled 2017-09-26: qty 1

## 2017-09-26 MED ORDER — DEXAMETHASONE SODIUM PHOSPHATE 10 MG/ML IJ SOLN
INTRAMUSCULAR | Status: DC | PRN
  Administered 2017-09-26: 4 mg via INTRAVENOUS

## 2017-09-26 MED ORDER — ONDANSETRON HCL 4 MG/2ML IJ SOLN
INTRAMUSCULAR | Status: AC
  Filled 2017-09-26: qty 2

## 2017-09-26 MED ORDER — PROPOFOL 10 MG/ML IV BOLUS
INTRAVENOUS | Status: DC | PRN
  Administered 2017-09-26: 200 mg via INTRAVENOUS

## 2017-09-26 MED ORDER — DEXAMETHASONE SODIUM PHOSPHATE 10 MG/ML IJ SOLN
INTRAMUSCULAR | Status: AC
  Filled 2017-09-26: qty 1

## 2017-09-26 MED ORDER — LIDOCAINE HCL (PF) 2 % IJ SOLN
INTRAMUSCULAR | Status: AC
  Filled 2017-09-26: qty 10

## 2017-09-26 MED ORDER — FAMOTIDINE 20 MG PO TABS
20.0000 mg | ORAL_TABLET | Freq: Once | ORAL | Status: AC
  Administered 2017-09-26: 20 mg via ORAL

## 2017-09-26 MED ORDER — MIDAZOLAM HCL 2 MG/2ML IJ SOLN
INTRAMUSCULAR | Status: DC | PRN
  Administered 2017-09-26: 2 mg via INTRAVENOUS

## 2017-09-26 MED ORDER — SUGAMMADEX SODIUM 500 MG/5ML IV SOLN
INTRAVENOUS | Status: AC
  Filled 2017-09-26: qty 5

## 2017-09-26 MED ORDER — SODIUM CHLORIDE 0.9 % IJ SOLN
INTRAMUSCULAR | Status: AC
  Filled 2017-09-26: qty 50

## 2017-09-26 MED ORDER — KETOROLAC TROMETHAMINE 30 MG/ML IJ SOLN
INTRAMUSCULAR | Status: AC
  Filled 2017-09-26: qty 1

## 2017-09-26 MED ORDER — MIDAZOLAM HCL 2 MG/2ML IJ SOLN
INTRAMUSCULAR | Status: AC
  Filled 2017-09-26: qty 2

## 2017-09-26 MED ORDER — IOTHALAMATE MEGLUMINE 60 % INJ SOLN
INTRAMUSCULAR | Status: DC | PRN
  Administered 2017-09-26: 40 mL

## 2017-09-26 MED ORDER — ONDANSETRON HCL 4 MG/2ML IJ SOLN
4.0000 mg | Freq: Once | INTRAMUSCULAR | Status: AC | PRN
  Administered 2017-09-26: 4 mg via INTRAVENOUS

## 2017-09-26 MED ORDER — SUCCINYLCHOLINE CHLORIDE 20 MG/ML IJ SOLN
INTRAMUSCULAR | Status: AC
  Filled 2017-09-26: qty 1

## 2017-09-26 MED ORDER — FENTANYL CITRATE (PF) 100 MCG/2ML IJ SOLN
25.0000 ug | INTRAMUSCULAR | Status: DC | PRN
  Administered 2017-09-26 (×3): 25 ug via INTRAVENOUS

## 2017-09-26 MED ORDER — ONDANSETRON HCL 4 MG/2ML IJ SOLN
INTRAMUSCULAR | Status: DC | PRN
  Administered 2017-09-26: 4 mg via INTRAVENOUS

## 2017-09-26 MED ORDER — FENTANYL CITRATE (PF) 100 MCG/2ML IJ SOLN
INTRAMUSCULAR | Status: AC
  Filled 2017-09-26: qty 2

## 2017-09-26 MED ORDER — SUGAMMADEX SODIUM 200 MG/2ML IV SOLN
INTRAVENOUS | Status: DC | PRN
  Administered 2017-09-26: 235 mg via INTRAVENOUS

## 2017-09-26 MED ORDER — OXYCODONE-ACETAMINOPHEN 5-325 MG PO TABS
1.0000 | ORAL_TABLET | Freq: Four times a day (QID) | ORAL | Status: DC | PRN
  Administered 2017-09-26 – 2017-09-28 (×3): 2 via ORAL
  Filled 2017-09-26 (×3): qty 2

## 2017-09-26 MED ORDER — PIPERACILLIN-TAZOBACTAM 3.375 G IVPB
INTRAVENOUS | Status: AC
  Filled 2017-09-26: qty 50

## 2017-09-26 MED ORDER — ROCURONIUM BROMIDE 100 MG/10ML IV SOLN
INTRAVENOUS | Status: DC | PRN
  Administered 2017-09-26: 10 mg via INTRAVENOUS
  Administered 2017-09-26: 5 mg via INTRAVENOUS
  Administered 2017-09-26: 45 mg via INTRAVENOUS

## 2017-09-26 MED ORDER — SODIUM CHLORIDE FLUSH 0.9 % IV SOLN
INTRAVENOUS | Status: AC
  Administered 2017-09-26: 17:00:00
  Filled 2017-09-26: qty 10

## 2017-09-26 MED ORDER — FENTANYL CITRATE (PF) 100 MCG/2ML IJ SOLN
INTRAMUSCULAR | Status: DC | PRN
  Administered 2017-09-26: 50 ug via INTRAVENOUS
  Administered 2017-09-26: 100 ug via INTRAVENOUS
  Administered 2017-09-26: 50 ug via INTRAVENOUS

## 2017-09-26 MED ORDER — PROPOFOL 10 MG/ML IV BOLUS
INTRAVENOUS | Status: AC
  Filled 2017-09-26: qty 20

## 2017-09-26 MED ORDER — ACETAMINOPHEN 10 MG/ML IV SOLN
INTRAVENOUS | Status: DC | PRN
  Administered 2017-09-26: 1000 mg via INTRAVENOUS

## 2017-09-26 MED ORDER — LIDOCAINE HCL 1 % IJ SOLN
INTRAMUSCULAR | Status: DC | PRN
  Administered 2017-09-26: 30 mL

## 2017-09-26 MED ORDER — BUPIVACAINE HCL (PF) 0.5 % IJ SOLN
INTRAMUSCULAR | Status: AC
  Filled 2017-09-26: qty 30

## 2017-09-26 MED ORDER — ROCURONIUM BROMIDE 50 MG/5ML IV SOLN
INTRAVENOUS | Status: AC
  Filled 2017-09-26: qty 1

## 2017-09-26 MED ORDER — LIDOCAINE HCL (PF) 1 % IJ SOLN
INTRAMUSCULAR | Status: AC
  Filled 2017-09-26: qty 30

## 2017-09-26 MED ORDER — ACETAMINOPHEN 10 MG/ML IV SOLN
INTRAVENOUS | Status: AC
  Filled 2017-09-26: qty 100

## 2017-09-26 SURGICAL SUPPLY — 43 items
ADHESIVE MASTISOL STRL (MISCELLANEOUS) ×2 IMPLANT
APPLIER CLIP ROT 10 11.4 M/L (STAPLE) ×2
BLADE SURG SZ11 CARB STEEL (BLADE) ×2 IMPLANT
CANISTER SUCT 1200ML W/VALVE (MISCELLANEOUS) ×2 IMPLANT
CATH CHOLANG 76X19 KUMAR (CATHETERS) ×2 IMPLANT
CHLORAPREP W/TINT 26ML (MISCELLANEOUS) ×2 IMPLANT
CLIP APPLIE ROT 10 11.4 M/L (STAPLE) ×1 IMPLANT
CONRAY 60ML FOR OR (MISCELLANEOUS) IMPLANT
DECANTER SPIKE VIAL GLASS SM (MISCELLANEOUS) ×4 IMPLANT
DEFOGGER SCOPE WARMER CLEARIFY (MISCELLANEOUS) ×2 IMPLANT
DRAPE SHEET LG 3/4 BI-LAMINATE (DRAPES) ×2 IMPLANT
DRSG TEGADERM 2-3/8X2-3/4 SM (GAUZE/BANDAGES/DRESSINGS) ×8 IMPLANT
DRSG TELFA 4X3 1S NADH ST (GAUZE/BANDAGES/DRESSINGS) ×2 IMPLANT
ELECT REM PT RETURN 9FT ADLT (ELECTROSURGICAL) ×2
ELECTRODE REM PT RTRN 9FT ADLT (ELECTROSURGICAL) ×1 IMPLANT
GLOVE BIO SURGEON STRL SZ7.5 (GLOVE) ×2 IMPLANT
GLOVE INDICATOR 8.0 STRL GRN (GLOVE) ×2 IMPLANT
GOWN STRL REUS W/ TWL LRG LVL3 (GOWN DISPOSABLE) ×3 IMPLANT
GOWN STRL REUS W/TWL LRG LVL3 (GOWN DISPOSABLE) ×3
GRASPER SUT TROCAR 14GX15 (MISCELLANEOUS) IMPLANT
IRRIGATION STRYKERFLOW (MISCELLANEOUS) ×1 IMPLANT
IRRIGATOR STRYKERFLOW (MISCELLANEOUS) ×2
IV NS 1000ML (IV SOLUTION)
IV NS 1000ML BAXH (IV SOLUTION) IMPLANT
L-HOOK LAP DISP 36CM (ELECTROSURGICAL) ×2
LABEL OR SOLS (LABEL) ×2 IMPLANT
LHOOK LAP DISP 36CM (ELECTROSURGICAL) ×1 IMPLANT
NEEDLE HYPO 25X1 1.5 SAFETY (NEEDLE) ×2 IMPLANT
NEEDLE VERESS 14GA 120MM (NEEDLE) ×2 IMPLANT
NS IRRIG 500ML POUR BTL (IV SOLUTION) ×2 IMPLANT
PACK LAP CHOLECYSTECTOMY (MISCELLANEOUS) ×2 IMPLANT
PENCIL ELECTRO HAND CTR (MISCELLANEOUS) ×2 IMPLANT
POUCH ENDO CATCH 10MM SPEC (MISCELLANEOUS) ×2 IMPLANT
SCISSORS METZENBAUM CVD 33 (INSTRUMENTS) ×2 IMPLANT
SLEEVE ENDOPATH XCEL 5M (ENDOMECHANICALS) ×4 IMPLANT
STRIP CLOSURE SKIN 1/2X4 (GAUZE/BANDAGES/DRESSINGS) ×2 IMPLANT
SUT MNCRL 4-0 (SUTURE) ×1
SUT MNCRL 4-0 27XMFL (SUTURE) ×1
SUT VICRYL 0 AB UR-6 (SUTURE) IMPLANT
SUTURE MNCRL 4-0 27XMF (SUTURE) ×1 IMPLANT
TROCAR XCEL 12X100 BLDLESS (ENDOMECHANICALS) ×2 IMPLANT
TROCAR XCEL NON-BLD 5MMX100MML (ENDOMECHANICALS) ×2 IMPLANT
TUBING INSUFFLATION (TUBING) ×2 IMPLANT

## 2017-09-26 NOTE — Anesthesia Preprocedure Evaluation (Signed)
Anesthesia Evaluation  Patient identified by MRN, date of birth, ID band Patient awake    Reviewed: Allergy & Precautions, H&P , NPO status , Patient's Chart, lab work & pertinent test results, reviewed documented beta blocker date and time   History of Anesthesia Complications Negative for: history of anesthetic complications  Airway Mallampati: I  TM Distance: >3 FB Neck ROM: full    Dental  (+) Dental Advidsory Given, Teeth Intact   Pulmonary neg pulmonary ROS,    breath sounds clear to auscultation       Cardiovascular Exercise Tolerance: Good negative cardio ROS       Neuro/Psych negative neurological ROS  negative psych ROS   GI/Hepatic Neg liver ROS, GERD  ,  Endo/Other  negative endocrine ROS  Renal/GU negative Renal ROS  negative genitourinary   Musculoskeletal   Abdominal   Peds  Hematology negative hematology ROS (+)   Anesthesia Other Findings Past Medical History: summer 2018: Gallstones     Comment:  diagnosed, but as of Dec 2018, no surgical intervention No date: GERD (gastroesophageal reflux disease)   Reproductive/Obstetrics negative OB ROS                             Anesthesia Physical Anesthesia Plan  ASA: II  Anesthesia Plan: General   Post-op Pain Management:    Induction: Intravenous  PONV Risk Score and Plan: 2 and Ondansetron and Dexamethasone  Airway Management Planned: Oral ETT  Additional Equipment:   Intra-op Plan:   Post-operative Plan: Extubation in OR  Informed Consent: I have reviewed the patients History and Physical, chart, labs and discussed the procedure including the risks, benefits and alternatives for the proposed anesthesia with the patient or authorized representative who has indicated his/her understanding and acceptance.   Dental Advisory Given  Plan Discussed with: Anesthesiologist, CRNA and Surgeon  Anesthesia Plan  Comments:         Anesthesia Quick Evaluation

## 2017-09-26 NOTE — Anesthesia Procedure Notes (Signed)
Procedure Name: Intubation Performed by: Lance Muss, CRNA Pre-anesthesia Checklist: Patient identified, Patient being monitored, Timeout performed, Emergency Drugs available and Suction available Patient Re-evaluated:Patient Re-evaluated prior to induction Oxygen Delivery Method: Circle system utilized Preoxygenation: Pre-oxygenation with 100% oxygen Induction Type: IV induction Ventilation: Mask ventilation without difficulty and Oral airway inserted - appropriate to patient size Laryngoscope Size: Mac and 3 Grade View: Grade I Tube type: Oral Tube size: 7.5 mm Number of attempts: 1 Airway Equipment and Method: Stylet Placement Confirmation: ETT inserted through vocal cords under direct vision,  positive ETCO2 and breath sounds checked- equal and bilateral Secured at: 24 cm Tube secured with: Tape Dental Injury: Teeth and Oropharynx as per pre-operative assessment

## 2017-09-26 NOTE — Plan of Care (Signed)
Pt went for lap choley today. Clear diet at this time and advance as tolerated

## 2017-09-26 NOTE — Anesthesia Post-op Follow-up Note (Signed)
Anesthesia QCDR form completed.        

## 2017-09-26 NOTE — Op Note (Signed)
Laparoscopic Cholecystectomy  Pre-operative Diagnosis: Acute cholecystitis  Post-operative Diagnosis: Same  Procedure: Laparoscopic cholecystectomy with attempted intraoperative cholangiogram  Surgeon: Brandie Lopes T. Oswin Johal, MD FACS  Anesthesia: Gen. with endotracheal tube  Assistant: None  Procedure Details  The patient was seen again in the Holding Room. The benefits, complications, treatment options, and expected outcomes were discussed with the patient. The risks of bleeding, infection, recurrence of symptoms, failure to resolve symptoms, bile duct damage, bile duct leak, retained common bile duct stone, bowel injury, any of which could require further surgery and/or ERCP, stent, or papillotomy were reviewed with the patient. The likelihood of improving the patient's symptoms with return to their baseline status is good.  The patient and/or family concurred with the proposed plan, giving informed consent.  The patient was taken to Operating Room, identified as Jerry Johns and the procedure verified as Laparoscopic Cholecystectomy.  A Time Out was held and the above information confirmed.  Prior to the induction of general anesthesia, antibiotic prophylaxis was administered. VTE prophylaxis was in place. General endotracheal anesthesia was then administered and tolerated well. After the induction, the abdomen was prepped with Chloraprep and draped in the sterile fashion. The patient was positioned in the supine position.  Local anesthetic  was injected into the skin near the umbilicus and an incision made. The Veress needle was placed. Pneumoperitoneum was then created with C<MEASUR<<MEASUREMEN<MEASUREMENB[facility]>VBerna AlcidCheree [phone], [facility]>VBerna AlcidCheree [phone] A ts were placed in the right upper quadrant and a 12 mm epigastric port was placed all under direct vision. All skin incisions  were  infiltrated with a local anesthetic agent before making the incision and placing the trocars.   The patient was positioned  in reverse Trendelenburg, tilted slightly to the patient's left.  The gallbladder was identified, the fundus grasped and retracted cephalad. Adhesions were lysed bluntly. The infundibulum was grasped and retracted laterally, exposing the peritoneum overlying the triangle of Calot. This was then divided and exposed in a blunt fashion. A critical view of the cystic duct and cystic artery was obtained.  The cystic duct was clearly identified and bluntly dissected.   In the process of this dissection there was a large volume arterial bleed from the mid body of the gallbladder.  Attempts at controlling it with electrocautery failed.  It was controlled with manual compression while the camera was cleaned after it was sprayed with blood.  The cystic artery was then identified and a single clip was placed upon it just proximal to the area of bleeding which controlled the pulsatile bleeding.  At this point we attempted a cholangiogram using a Kumar catheter and clamped.  The catheter and clamp were placed and bile was returned into the catheter however it was noted that the gallstone that was present on imaging was between the clamp and the cystic duct.  The clamp was opened and the gallstone was milked distal prior to the Kumar clamp being resecured.  Under fluoroscopy contrast was instilled but after 3 attempts it was noted that the contrast was only going into the gallbladder and was found to be trapped within the clamp going into the gallbladder body.  In maneuvering to correct this contrast build into the peritoneal cavity and the cholangiogram was aborted.  The easily identified cystic artery and duct were then serially clipped and cut with endoscopic shears. The gallbladder was  taken from the gallbladder fossa in a retrograde fashion with the electrocautery. The gallbladder was removed  and placed in an Endocatch bag.   The liver bed was irrigated and inspected.  There was additional bleeding noted from the gallbladder bed that initially was not controlled with electrocautery.  A sheet of Surgicel was placed over the area of bleeding and pressure was held.  The abdomen was then copiously irrigated and our attention return to the area of bleeding.  The bleeding was less and this time hemostasis was achieved with the electrocautery. Copious irrigation was utilized and was repeatedly aspirated until clear.  The gallbladder and Endocatch sac were then removed through the epigastric port site.   Inspection of the right upper quadrant was performed. No bleeding, bile duct injury or leak, or bowel injury was noted. Pneumoperitoneum was released.  The epigastric port site was closed with figure-of-eight 0 Vicryl sutures. 4-0 subcuticular Monocryl was used to close the skin. Steristrips and Mastisol and sterile dressings were  applied.  The patient was then extubated and brought to the recovery room in stable condition. Sponge, lap, and needle counts were correct at closure and at the conclusion of the case.   Findings: Acute cholecystitis   Estimated Blood Loss: 200 mL         Drains: None         Specimens: Gallbladder           Complications: none               Chirsty Armistead T. Tonita CongWoodham, MD, FACS

## 2017-09-26 NOTE — Progress Notes (Signed)
CC: Cholecystitis Subjective: Patient reports doing well overnight.  Pain is fully resolved with current therapy.  Surgery was delayed secondary to emergencies in the operating room yesterday.  Objective: Vital signs in last 24 hours: Temp:  [97.4 F (36.3 C)-98.3 F (36.8 C)] 97.7 F (36.5 C) (12/27 0853) Pulse Rate:  [50-65] 54 (12/27 0853) Resp:  [14-18] 14 (12/27 0853) BP: (112-153)/(62-77) 117/70 (12/27 0853) SpO2:  [96 %-99 %] 98 % (12/27 0853) Last BM Date: 09/24/17  Intake/Output from previous day: 12/26 0701 - 12/27 0700 In: 2740 [I.V.:2560; IV Piggyback:180] Out: 300 [Urine:300] Intake/Output this shift: No intake/output data recorded.  Physical exam:  General: No acute distress Chest: Clear to auscultation Heart: Regular rhythm Abdomen: Soft, nontender, nondistended  Lab Results: CBC  Recent Labs    09/24/17 2340 09/25/17 0749  WBC 9.0 10.6  HGB 16.1 14.9  HCT 47.5 43.0  PLT 288 254   BMET Recent Labs    09/24/17 2340 09/25/17 0749  NA 137 137  K 4.2 3.7  CL 105 107  CO2 25 24  GLUCOSE 105* 99  BUN 16 13  CREATININE 1.00 0.81  CALCIUM 9.3 8.6*   PT/INR No results for input(s): LABPROT, INR in the last 72 hours. ABG No results for input(s): PHART, HCO3 in the last 72 hours.  Invalid input(s): PCO2, PO2  Studies/Results: Koreas Abdomen Limited Ruq  Result Date: 09/25/2017 CLINICAL DATA:  Acute onset of severe epigastric and right upper quadrant abdominal pain. EXAM: ULTRASOUND ABDOMEN LIMITED RIGHT UPPER QUADRANT COMPARISON:  Right upper quadrant ultrasound performed 05/13/2017 FINDINGS: Gallbladder: A 1.3 cm stone is lodged at the neck of the gallbladder. Mild gallbladder wall thickening is noted. No pericholecystic fluid is seen. A positive ultrasonographic Murphy's sign is elicited. Common bile duct: Diameter: 0.3 cm, within normal limits in caliber. Liver: No focal lesion identified. Within normal limits in parenchymal echogenicity. Portal  vein is patent on color Doppler imaging with normal direction of blood flow towards the liver. IMPRESSION: 1.3 cm stone lodged at the neck of the gallbladder. Mild gallbladder wall thickening, with a positive ultrasonographic Murphy's sign. This raises concern for mild acute cholecystitis. Electronically Signed   By: Roanna RaiderJeffery  Chang M.D.   On: 09/25/2017 04:36    Anti-infectives: Anti-infectives (From admission, onward)   Start     Dose/Rate Route Frequency Ordered Stop   09/25/17 0600  piperacillin-tazobactam (ZOSYN) IVPB 3.375 g     3.375 g 12.5 mL/hr over 240 Minutes Intravenous Every 8 hours 09/25/17 0547        Assessment/Plan:  46 year old male admitted with acute cholecystitis.  Again discussed the surgery of a laparoscopic cholecystectomy.  He voiced understanding and desires to proceed.  Plan to proceed to the operating room later today as the OR schedule allows.  Chrisma Hurlock T. Tonita CongWoodham, MD, [facility] RehabilitationFACS General Surgeon [facility]  Day [facility] [phone](7a-7p) [phone] Night [facility] [phone](7p-7a) [phone] 09/26/2017

## 2017-09-26 NOTE — Brief Op Note (Signed)
09/26/2017  2:40 PM  PATIENT:  Mort SawyersJohn L Mccandlish  46 y.o. male  PRE-OPERATIVE DIAGNOSIS: Cholecystitis  POST-OPERATIVE DIAGNOSIS: Same   PROCEDURE:  Procedure(s): LAPAROSCOPIC CHOLECYSTECTOMY (N/A)  SURGEON:  Surgeon(s) and Role:    * Ricarda FrameWoodham, Dontray Haberland, MD - Primary  PHYSICIAN ASSISTANT:   ASSISTANTS: none   ANESTHESIA:   general  EBL:  200 mL   BLOOD ADMINISTERED:none  DRAINS: none   LOCAL MEDICATIONS USED:  MARCAINE   , XYLOCAINE  and Amount: 30 ml  SPECIMEN:  Source of Specimen:  Gallbladder  DISPOSITION OF SPECIMEN:  PATHOLOGY  COUNTS:  YES  TOURNIQUET:  * No tourniquets in log *  DICTATION: .Dragon Dictation  PLAN OF CARE: Admit for overnight observation  PATIENT DISPOSITION:  PACU - hemodynamically stable.   Delay start of Pharmacological VTE agent (>24hrs) due to surgical blood loss or risk of bleeding: yes

## 2017-09-26 NOTE — Transfer of Care (Signed)
Immediate Anesthesia Transfer of Care Note  Patient: Jerry SawyersJohn L Johns  Procedure(s) Performed: LAPAROSCOPIC CHOLECYSTECTOMY (N/A )  Patient Location: PACU  Anesthesia Type:General  Level of Consciousness: sedated  Airway & Oxygen Therapy: Patient Spontanous Breathing and Patient connected to face mask oxygen  Post-op Assessment: Report given to RN and Post -op Vital signs reviewed and stable  Post vital signs: Reviewed and stable  Last Vitals:  Vitals:   09/26/17 1448 09/26/17 1449  BP: 101/60 101/60  Pulse: 72 65  Resp: 15 14  Temp:    SpO2: 93% 94%    Last Pain:  Vitals:   09/26/17 1448  TempSrc:   PainSc: Asleep      Patients Stated Pain Goal: 1 (09/26/17 1222)  Complications: No apparent anesthesia complications

## 2017-09-27 ENCOUNTER — Encounter: Payer: Self-pay | Admitting: General Surgery

## 2017-09-27 LAB — COMPREHENSIVE METABOLIC PANEL
ALT: 165 U/L — ABNORMAL HIGH (ref 17–63)
ANION GAP: 4 — AB (ref 5–15)
AST: 124 U/L — ABNORMAL HIGH (ref 15–41)
Albumin: 3.9 g/dL (ref 3.5–5.0)
Alkaline Phosphatase: 51 U/L (ref 38–126)
BUN: 9 mg/dL (ref 6–20)
CHLORIDE: 107 mmol/L (ref 101–111)
CO2: 26 mmol/L (ref 22–32)
CREATININE: 0.98 mg/dL (ref 0.61–1.24)
Calcium: 8.7 mg/dL — ABNORMAL LOW (ref 8.9–10.3)
Glucose, Bld: 144 mg/dL — ABNORMAL HIGH (ref 65–99)
POTASSIUM: 3.8 mmol/L (ref 3.5–5.1)
Sodium: 137 mmol/L (ref 135–145)
Total Bilirubin: 1.8 mg/dL — ABNORMAL HIGH (ref 0.3–1.2)
Total Protein: 6.8 g/dL (ref 6.5–8.1)

## 2017-09-27 LAB — CBC
HCT: 43.1 % (ref 40.0–52.0)
Hemoglobin: 14.9 g/dL (ref 13.0–18.0)
MCH: 30.2 pg (ref 26.0–34.0)
MCHC: 34.6 g/dL (ref 32.0–36.0)
MCV: 87.3 fL (ref 80.0–100.0)
PLATELETS: 243 10*3/uL (ref 150–440)
RBC: 4.94 MIL/uL (ref 4.40–5.90)
RDW: 13.1 % (ref 11.5–14.5)
WBC: 12.4 10*3/uL — ABNORMAL HIGH (ref 3.8–10.6)

## 2017-09-27 NOTE — Progress Notes (Signed)
Pt continues to have a successful recovery. No complaints of pain or nausea during my shift. Pt advanced to a soft diet and has tolerated his lunch.

## 2017-09-27 NOTE — Progress Notes (Signed)
1 Day Post-Op  Subjective: Patient status post laparoscopic cholecystectomy which was complicated by considerable bleeding.  He has minimal pain today.  He feels well.  No fevers or chills.  Objective: Vital signs in last 24 hours: Temp:  [97.4 F (36.3 C)-97.9 F (36.6 C)] 97.4 F (36.3 C) (12/28 0538) Pulse Rate:  [54-94] 66 (12/28 0538) Resp:  [10-18] 18 (12/28 0538) BP: (93-138)/(60-90) 106/64 (12/28 0538) SpO2:  [91 %-98 %] 97 % (12/28 0538) Weight:  [260 lb (117.9 kg)] 260 lb (117.9 kg) (12/27 1222) Last BM Date: 09/26/17  Intake/Output from previous day: 12/27 0701 - 12/28 0700 In: 2466.5 [I.V.:2466.5] Out: 2005 [Urine:1805; Blood:200] Intake/Output this shift: No intake/output data recorded.  Physical exam:  Vital signs reviewed and stable no icterus no jaundice abdomen is soft wounds are clean without erythema or drainage no jaundice calves are nontender  Lab Results: CBC  Recent Labs    09/25/17 0749 09/27/17 0346  WBC 10.6 12.4*  HGB 14.9 14.9  HCT 43.0 43.1  PLT 254 243   BMET Recent Labs    09/25/17 0749 09/27/17 0346  NA 137 137  K 3.7 3.8  CL 107 107  CO2 24 26  GLUCOSE 99 144*  BUN 13 9  CREATININE 0.81 0.98  CALCIUM 8.6* 8.7*   PT/INR No results for input(s): LABPROT, INR in the last 72 hours. ABG No results for input(s): PHART, HCO3 in the last 72 hours.  Invalid input(s): PCO2, PO2  Studies/Results: Dg Cholangiogram Operative  Result Date: 09/26/2017 CLINICAL DATA:  46 year old male with a history of cholelithiasis EXAM: INTRAOPERATIVE CHOLANGIOGRAM TECHNIQUE: Cholangiographic images from the C-arm fluoroscopic device were submitted for interpretation post-operatively. Please see the procedural report for the amount of contrast and the fluoroscopy time utilized. COMPARISON:  09/25/2017 FINDINGS: Surgical instruments project over the upper abdomen. There is cannulation of the cystic duct/gallbladder neck, with antegrade infusion of  small volume contrast, partially opacifying the extrahepatic biliary ductal system. Contrast reaches the duodenum which is partially opacified. IMPRESSION: Intraoperative cholangiogram demonstrates small volume transient contrast within the extrahepatic ductal system. Although contrast does reach the duodenum, the study cannot exclude choledocholithiasis. Please refer to the dictated operative report for full details of intraoperative findings and procedure Electronically Signed   By: Gilmer MorJaime  Wagner D.O.   On: 09/26/2017 17:08    Anti-infectives: Anti-infectives (From admission, onward)   Start     Dose/Rate Route Frequency Ordered Stop   09/26/17 1242  piperacillin-tazobactam (ZOSYN) 3.375 GM/50ML IVPB    Comments:  Dellis FilbertSanchez, Becky   : cabinet override      09/26/17 1242 09/27/17 0059   09/25/17 0600  piperacillin-tazobactam (ZOSYN) IVPB 3.375 g  Status:  Discontinued     3.375 g 12.5 mL/hr over 240 Minutes Intravenous Every 8 hours 09/25/17 0547 09/26/17 1809      Assessment/Plan: s/p Procedure(s): LAPAROSCOPIC CHOLECYSTECTOMY   LFTs are elevated.  Will advance diet slowly and reexamine.  Likely continue IV antibiotics at this point.  Possibly home later today or tomorrow.  Lattie Hawichard E Jenayah Antu, MD, FACS  09/27/2017

## 2017-09-28 LAB — COMPREHENSIVE METABOLIC PANEL
ALBUMIN: 3.6 g/dL (ref 3.5–5.0)
ALK PHOS: 52 U/L (ref 38–126)
ALT: 155 U/L — ABNORMAL HIGH (ref 17–63)
ANION GAP: 7 (ref 5–15)
AST: 67 U/L — ABNORMAL HIGH (ref 15–41)
BUN: 11 mg/dL (ref 6–20)
CALCIUM: 8.5 mg/dL — AB (ref 8.9–10.3)
CHLORIDE: 103 mmol/L (ref 101–111)
CO2: 27 mmol/L (ref 22–32)
Creatinine, Ser: 0.93 mg/dL (ref 0.61–1.24)
GFR calc Af Amer: >60 mL/min (ref >60)
GFR calc non Af Amer: >60 mL/min (ref >60)
GLUCOSE: 102 mg/dL — AB (ref 65–99)
POTASSIUM: 3.5 mmol/L (ref 3.5–5.1)
SODIUM: 137 mmol/L (ref 135–145)
Total Bilirubin: 1.6 mg/dL — ABNORMAL HIGH (ref 0.3–1.2)
Total Protein: 6.8 g/dL (ref 6.5–8.1)

## 2017-09-28 LAB — CBC
HCT: 40.7 % (ref 40.0–52.0)
Hemoglobin: 14 g/dL (ref 13.0–18.0)
MCH: 30.4 pg (ref 26.0–34.0)
MCHC: 34.4 g/dL (ref 32.0–36.0)
MCV: 88.3 fL (ref 80.0–100.0)
Platelets: 237 K/uL (ref 150–440)
RBC: 4.61 MIL/uL (ref 4.40–5.90)
RDW: 13.7 % (ref 11.5–14.5)
WBC: 9.8 K/uL (ref 3.8–10.6)

## 2017-09-28 MED ORDER — OXYCODONE-ACETAMINOPHEN 5-325 MG PO TABS: 1.0000 | ORAL_TABLET | Freq: Four times a day (QID) | ORAL | 0 refills | Status: DC | PRN

## 2017-09-28 NOTE — Discharge Instructions (Signed)
Remove dressings tomorrow May shower Resume home medications and oral analgesics as needed Follow-up with Dr. Tonita CongWoodham in 10 days

## 2017-09-28 NOTE — Progress Notes (Signed)
MD ordered patient to be discharged home.  Discharge instructions were reviewed with the patient and he voiced understanding. Patient instructed on making his follow-up appointment.  Prescription given to the patient.  IV was removed with catheter intact.  All patients questions were answered.  Patient leaving via wheelchair escorted by  auxillary.

## 2017-09-28 NOTE — Progress Notes (Signed)
2 Days Post-Op  Subjective: Patient feels better today and is tolerating a diet.  He is ready for discharge.  Objective: Vital signs in last 24 hours: Temp:  [97.6 F (36.4 C)-98.6 F (37 C)] 98.6 F (37 C) (12/29 0450) Pulse Rate:  [73-82] 81 (12/29 0450) Resp:  [16-18] 16 (12/29 0450) BP: (107-115)/(62-67) 109/67 (12/29 0450) SpO2:  [94 %-95 %] 95 % (12/29 0450) Last BM Date: 09/27/17  Intake/Output from previous day: 12/28 0701 - 12/29 0700 In: 2325.7 [P.O.:480; I.V.:1845.7] Out: 600 [Urine:600] Intake/Output this shift: Total I/O In: 151 [I.V.:151] Out: -   Physical exam:  Wounds are clean no erythema no drainage vital signs are stable and reviewed no icterus no jaundice  Lab Results: CBC  Recent Labs    09/27/17 0346 09/28/17 0611  WBC 12.4* 9.8  HGB 14.9 14.0  HCT 43.1 40.7  PLT 243 237   BMET Recent Labs    09/27/17 0346 09/28/17 0611  NA 137 137  K 3.8 3.5  CL 107 103  CO2 26 27  GLUCOSE 144* 102*  BUN 9 11  CREATININE 0.98 0.93  CALCIUM 8.7* 8.5*   PT/INR No results for input(s): LABPROT, INR in the last 72 hours. ABG No results for input(s): PHART, HCO3 in the last 72 hours.  Invalid input(s): PCO2, PO2  Studies/Results: Dg Cholangiogram Operative  Result Date: 09/26/2017 CLINICAL DATA:  46 year old male with a history of cholelithiasis EXAM: INTRAOPERATIVE CHOLANGIOGRAM TECHNIQUE: Cholangiographic images from the C-arm fluoroscopic device were submitted for interpretation post-operatively. Please see the procedural report for the amount of contrast and the fluoroscopy time utilized. COMPARISON:  09/25/2017 FINDINGS: Surgical instruments project over the upper abdomen. There is cannulation of the cystic duct/gallbladder neck, with antegrade infusion of small volume contrast, partially opacifying the extrahepatic biliary ductal system. Contrast reaches the duodenum which is partially opacified. IMPRESSION: Intraoperative cholangiogram  demonstrates small volume transient contrast within the extrahepatic ductal system. Although contrast does reach the duodenum, the study cannot exclude choledocholithiasis. Please refer to the dictated operative report for full details of intraoperative findings and procedure Electronically Signed   By: Gilmer MorJaime  Wagner D.O.   On: 09/26/2017 17:08    Anti-infectives: Anti-infectives (From admission, onward)   Start     Dose/Rate Route Frequency Ordered Stop   09/26/17 1242  piperacillin-tazobactam (ZOSYN) 3.375 GM/50ML IVPB    Comments:  Dellis FilbertSanchez, Becky   : cabinet override      09/26/17 1242 09/27/17 0059   09/25/17 0600  piperacillin-tazobactam (ZOSYN) IVPB 3.375 g  Status:  Discontinued     3.375 g 12.5 mL/hr over 240 Minutes Intravenous Every 8 hours 09/25/17 0547 09/26/17 1809      Assessment/Plan: s/p Procedure(s): LAPAROSCOPIC CHOLECYSTECTOMY   Mildly elevated LFTs are improved.  Patient doing quite well tolerating a regular diet.  Will discharge today to follow-up in 10 days questions answered for him  Lattie Hawichard E Kmarion Rawl, MD, FACS  09/28/2017

## 2017-09-28 NOTE — Discharge Summary (Signed)
Physician Discharge Summary  Patient ID: Jerry Johns MRN: [id number] DOB/AGE: 46/11/1970 46 y.o.  Admit date: 09/25/2017 Discharge date: 09/28/2017 Jerry Johns  Discharge Diagnoses:  Active Problems:   Cholecystitis   Procedures: Laparoscopic cholecystectomy  Hospital Course: This patient admitted to the hospital for acute cholecystitis.  He underwent a laparoscopic cholecystectomy by Dr. Armond Johns.  Dr. Armond Johns experienced considerable bleeding but that was controlled.  Postoperatively the patient did well and is advanced to diet.  His LFTs were slightly elevated but have improved.  He has no sign of jaundice or icterus. Will discharge today to follow-up with Dr. Armond Johns in 10 days.  He is instructed to remove dressings tomorrow and shower.  He is given oral analgesics for pain  Consults: None  Disposition: Final discharge disposition not confirmed   Allergies as of 09/28/2017   No Known Allergies     Medication List    STOP taking these medications   amoxicillin 875 MG tablet Commonly known as:  AMOXIL   predniSONE 10 MG tablet Commonly known as:  DELTASONE     TAKE these medications   ibuprofen 800 MG tablet Commonly known as:  ADVIL,MOTRIN Take 800 mg by mouth every 6 (six) hours as needed for fever or moderate pain.   oxyCODONE-acetaminophen 5-325 MG tablet Commonly known as:  PERCOCET/ROXICET Take 1-2 tablets by mouth every 6 (six) hours as needed for moderate pain or severe pain.      Follow-up Information    Jerry Johns, Charles, MD. Schedule an appointment as soon as possible for a visit in 10 day(s).   Specialty:  General Surgery Contact information: [address] [place] [id number] [phone]           Jerry Hawichard E Maelynn Moroney, MD, FACS

## 2017-09-30 LAB — SURGICAL PATHOLOGY

## 2017-10-16 ENCOUNTER — Ambulatory Visit (INDEPENDENT_AMBULATORY_CARE_PROVIDER_SITE_OTHER): Payer: Managed Care, Other (non HMO) | Admitting: General Surgery

## 2017-10-16 ENCOUNTER — Encounter: Payer: Self-pay | Admitting: General Surgery

## 2017-10-16 VITALS — BP 128/86 | HR 78 | Temp 98.1°F | Ht 70.0 in | Wt 276.8 lb

## 2017-10-16 DIAGNOSIS — Z4889 Encounter for other specified surgical aftercare: Secondary | ICD-10-CM

## 2017-10-16 NOTE — Progress Notes (Signed)
Outpatient Surgical Follow Up  10/16/2017  Jerry Johns is an 47 y.o. male.   Chief Complaint  Patient presents with  . Routine Post Op    Laparoscopic Cholecystectomy 09/26/17    HPI: 47 year old male returns to clinic for follow-up now 2 weeks status post laparoscopic cholecystectomy.  Doing very well.  He denies any pain.  He is eating well and having normal bowel function.  He denies any fevers, chills, nausea, vomiting, chest pain, shortness of breath, diarrhea, constipation.  Past Medical History:  Diagnosis Date  . Gallstones summer 2018   diagnosed, but as of Dec 2018, no surgical intervention  . GERD (gastroesophageal reflux disease)     Past Surgical History:  Procedure Laterality Date  . CHOLECYSTECTOMY N/A 09/26/2017   Procedure: LAPAROSCOPIC CHOLECYSTECTOMY;  Surgeon: Ricarda FrameWoodham, Jahziah Simonin, MD;  Location: [facility] ORS;  Service: General;  Laterality: N/A;  . VASECTOMY    . WISDOM TOOTH EXTRACTION      Family History  Problem Relation Age of Onset  . Diabetes Father   . Cancer Father        esophageal  . Diabetes Maternal Grandmother   . Diabetes Maternal Grandfather   . Diabetes Paternal Grandmother   . Diabetes Paternal Grandfather     Social History:  reports that  has never smoked. he has never used smokeless tobacco. He reports that he drinks about 1.2 oz of alcohol per week. He reports that he does not use drugs.  Allergies: No Known Allergies  Medications reviewed.    ROS A multipoint review of systems was completed, all pertinent positives and negatives were document within the HPI and the remainder were negative   BP 128/86   Pulse 78   Temp 98.1 F (36.7 C) (Oral)   Ht 5\' 10"  (1.778 m)   Wt 125.6 kg (276 lb 12.8 oz)   BMI 39.72 kg/m   Physical Exam  General: No acute distress  chest: Clear to auscultation Heart: Regular rate and rhythm Abdomen: Soft, nontender, nondistended.  Well approximated and healing laparoscopic incision sites  without any evidence of erythema or drainage.   No results found for this or any previous visit (from the past 48 hour(s)). No results found.  Assessment/Plan:  1. Aftercare following surgery 47 year old male status post laparoscopic cholecystectomy.  Pathology reviewed with the patient.  Discussed standard postoperative precautions and signs of infection.  Patient voiced understanding and will follow-up in clinic on an as-needed basis.  Note provided for work today.     Ricarda Frameharles Kalyani Maeda, MD FACS General Surgeon  10/16/2017,10:51 AM

## 2017-10-16 NOTE — Patient Instructions (Signed)

## 2018-08-24 IMAGING — US US ABDOMEN LIMITED
1 series · 14 of 25 positions shown · non-contrast
Comparison: Right upper quadrant ultrasound performed 05/13/2017

CLINICAL DATA: Acute onset of severe epigastric and right upper
quadrant abdominal pain.

EXAM:
ULTRASOUND ABDOMEN LIMITED RIGHT UPPER QUADRANT

[Series 1: us abdomen limited · 0.27mm/px · 14 of 40 slices shown]
[im 1/40]
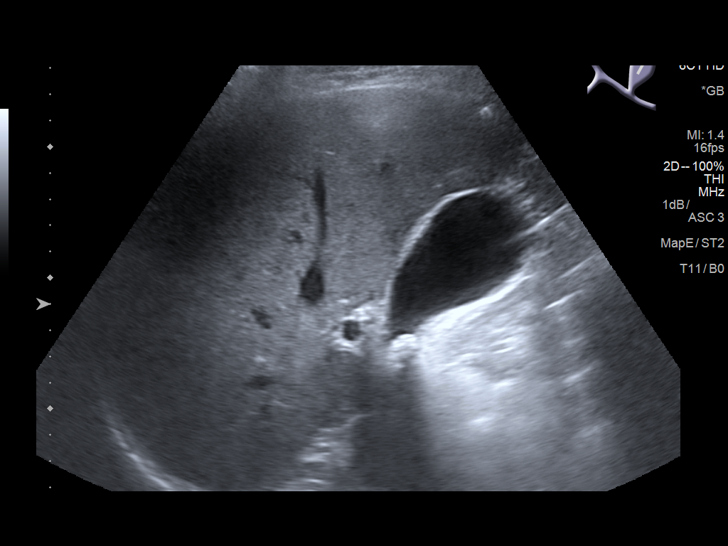
[im 4/40]
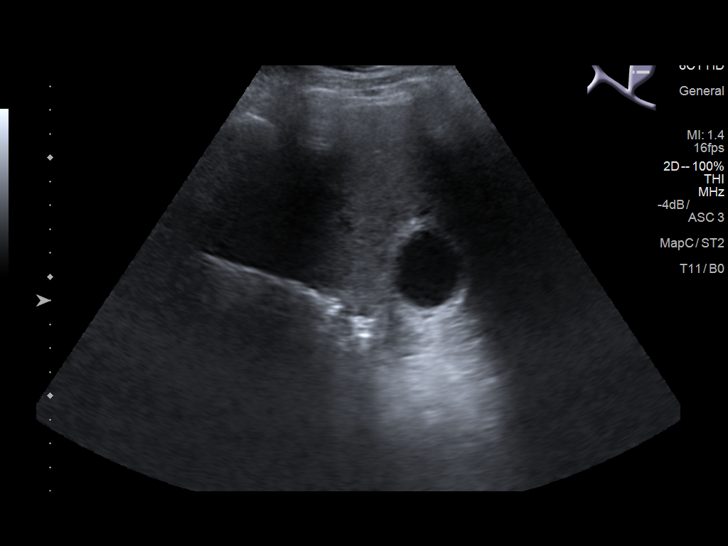
[im 7/40]
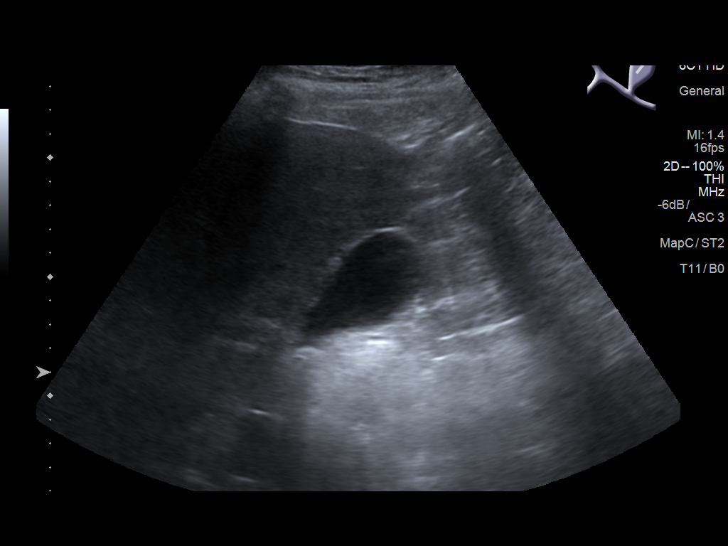
[im 10/40]
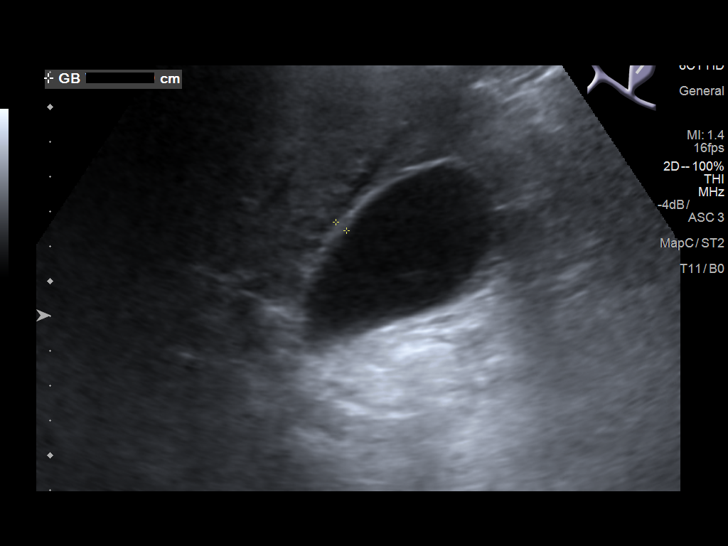
[im 14/40]
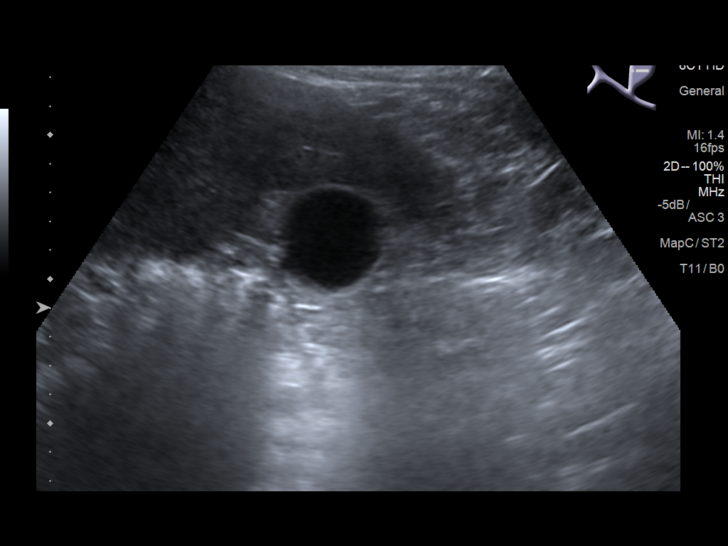
[im 15/40]
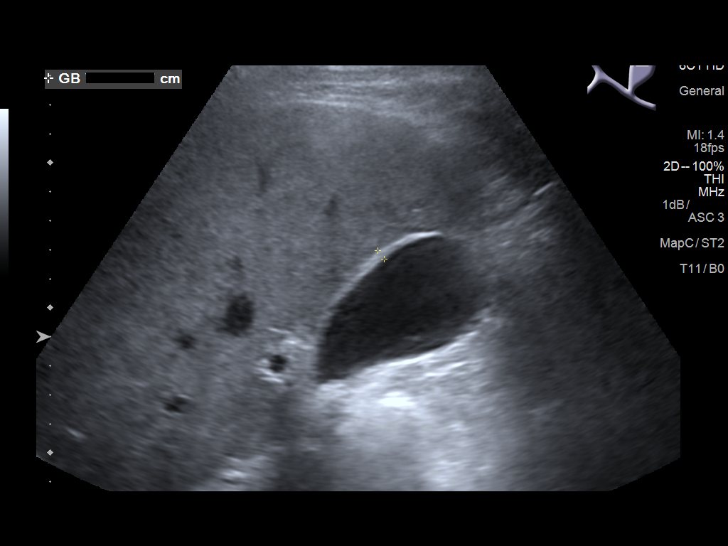
[im 18/40]
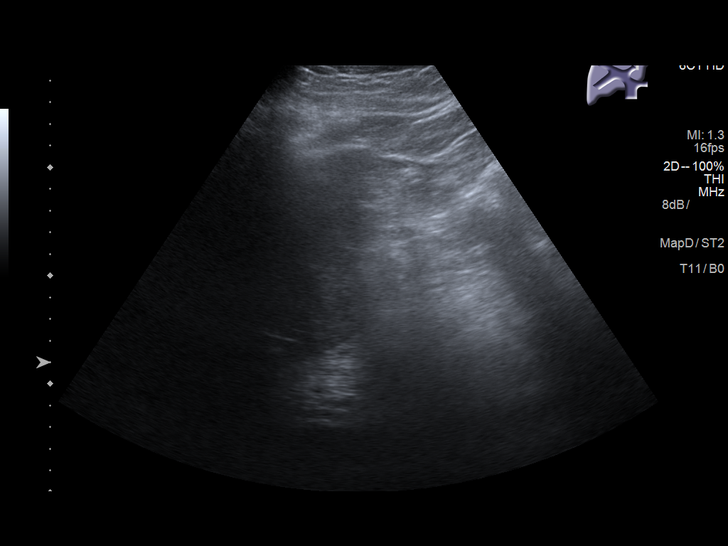
[im 22/40]
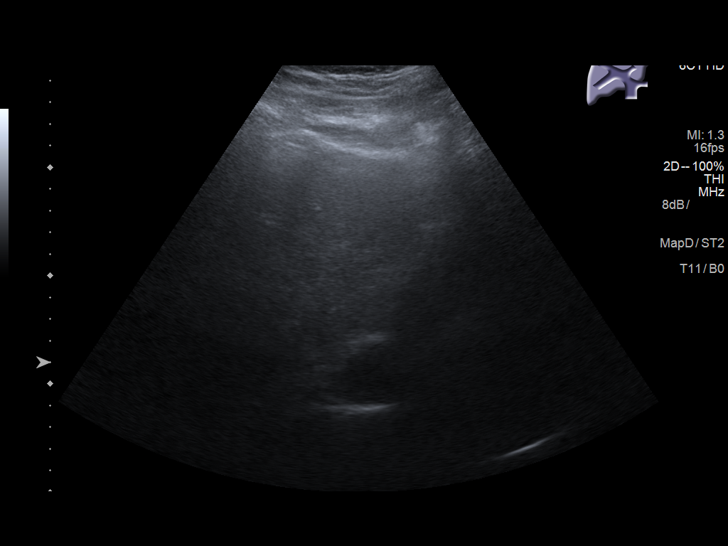
[im 25/40]
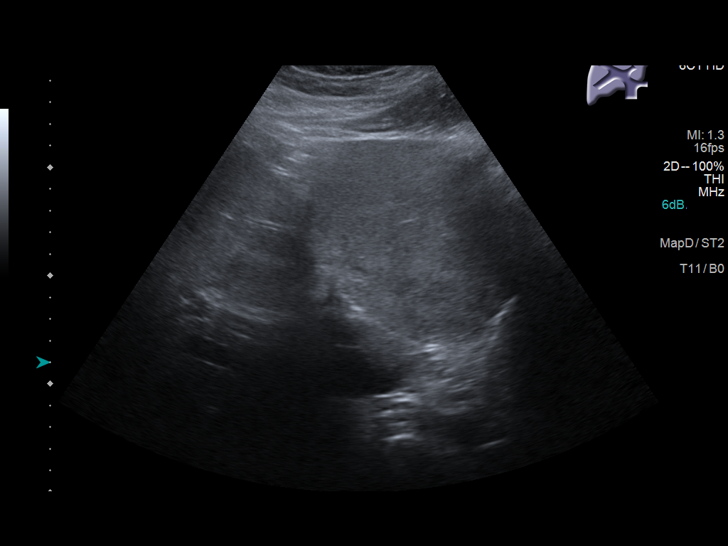
[im 27/40]
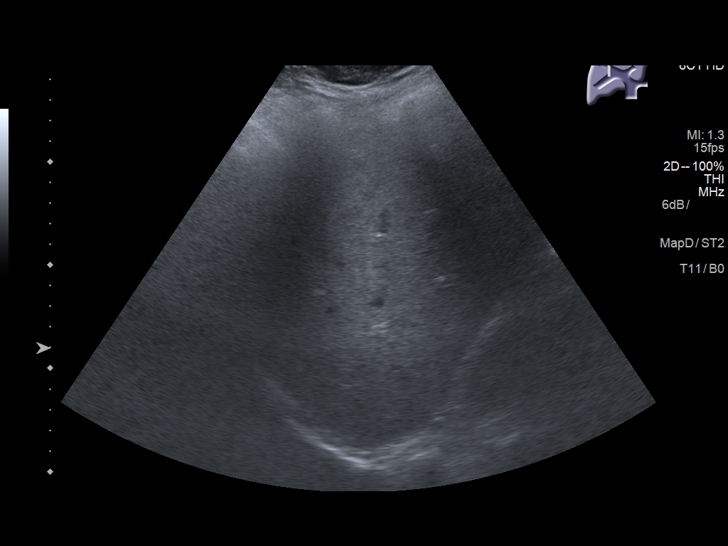
[im 30/40]
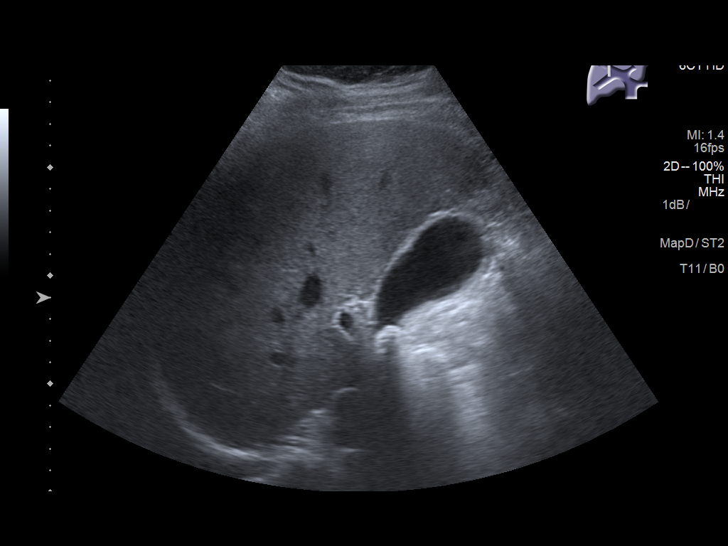
[im 33/40]
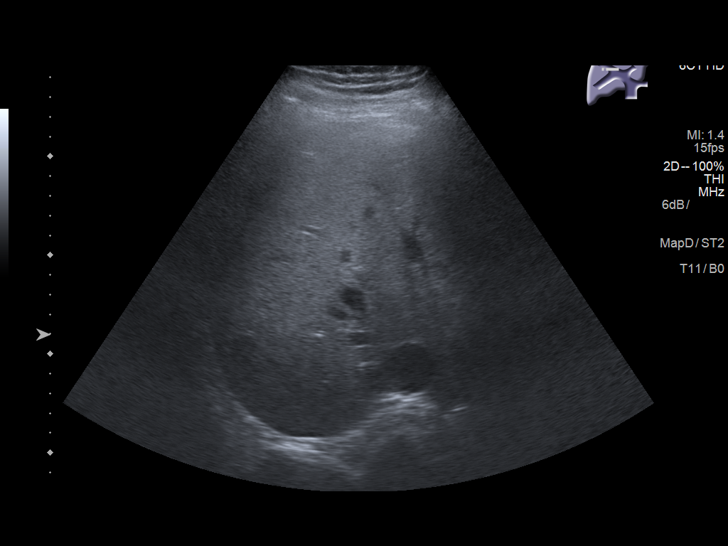
[im 36/40]
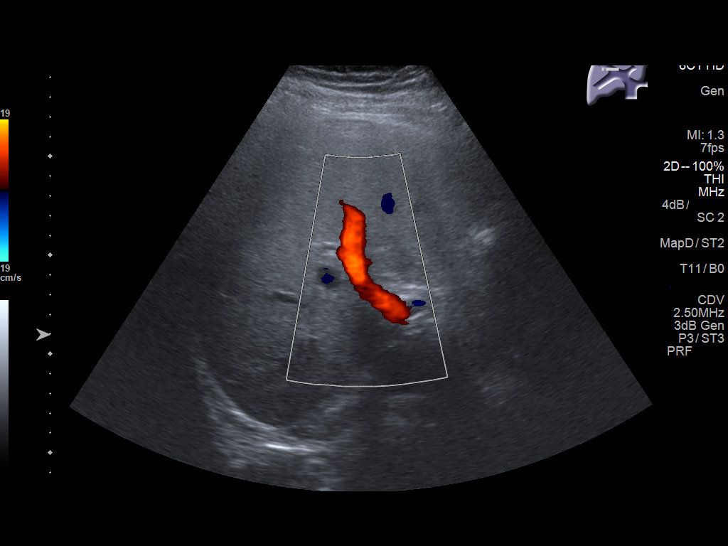
[im 40/40]
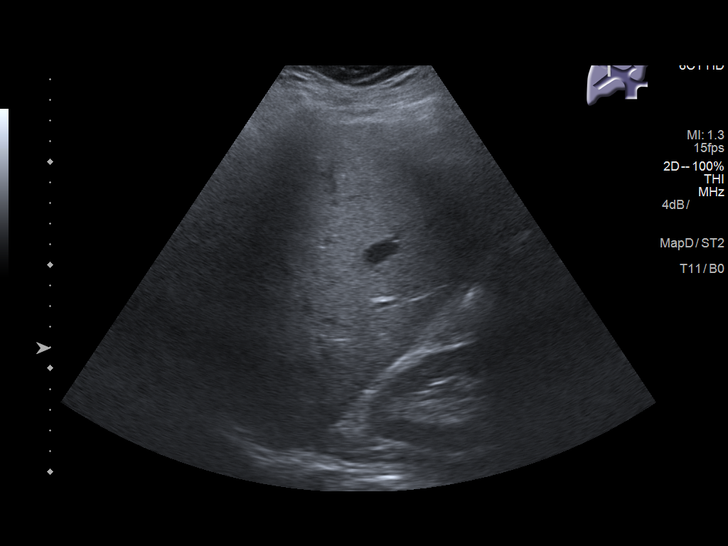

[14 of 25 positions shown; findings below may reference images not displayed]

FINDINGS: Gallbladder:

A 1.3 cm stone is lodged at the neck of the gallbladder. Mild
gallbladder wall thickening is noted. No pericholecystic fluid is
seen. A positive ultrasonographic Murphy's sign is elicited.

Common bile duct:

Diameter: 0.3 cm, within normal limits in caliber.

Liver:

No focal lesion identified. Within normal limits in parenchymal
echogenicity. Portal vein is patent on color Doppler imaging with
normal direction of blood flow towards the liver.
IMPRESSION: 1.3 cm stone lodged at the neck of the gallbladder. Mild gallbladder
wall thickening, with a positive ultrasonographic Murphy's sign.
This raises concern for mild acute cholecystitis.

## 2018-10-02 IMAGING — XA DG CHOLANGIOGRAM OPERATIVE
4 series · 14 of 16 positions shown · non-contrast
Comparison: 09/25/2017

CLINICAL DATA: 46-year-old male with a history of cholelithiasis

EXAM:
INTRAOPERATIVE CHOLANGIOGRAM
TECHNIQUE: Cholangiographic images from the C-arm fluoroscopic device were
submitted for interpretation post-operatively. Please see the
procedural report for the amount of contrast and the fluoroscopy
time utilized.

[Series 10: cont. · 3 of 82 frames shown (1 of 4)]
[frame 13/82]
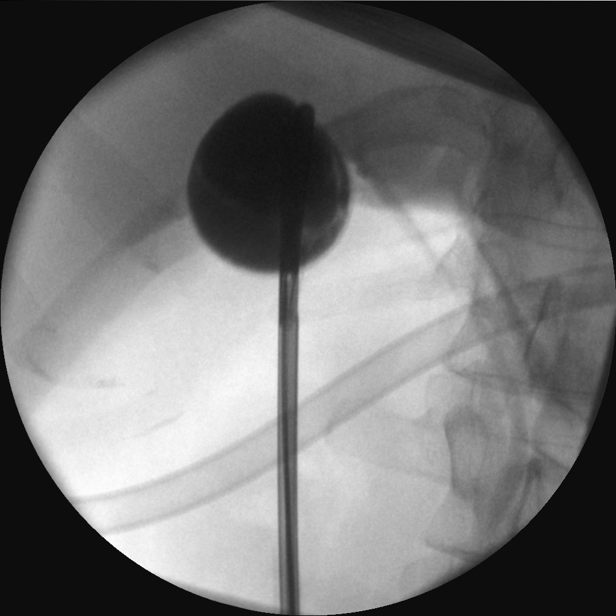
[frame 42/82]
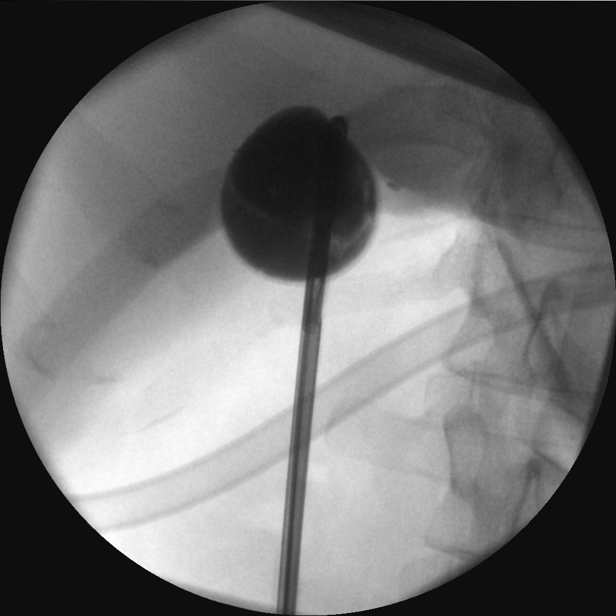
[frame 70/82]
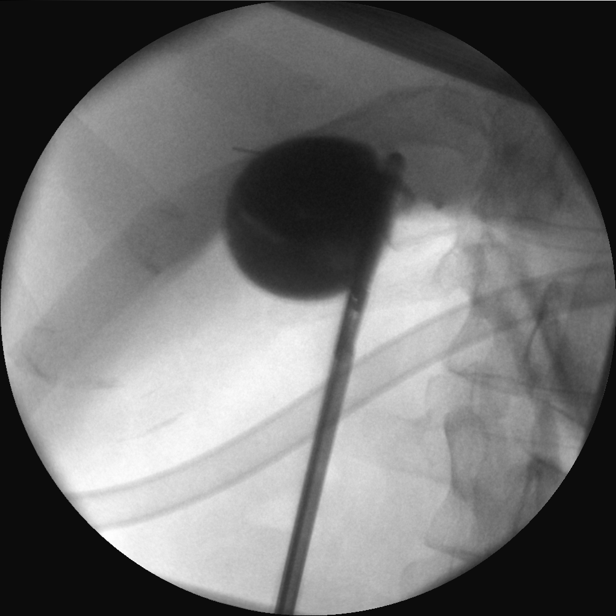

[Series 11: cont. · 4 of 55 frames shown (2 of 4)]
[frame 9/55]
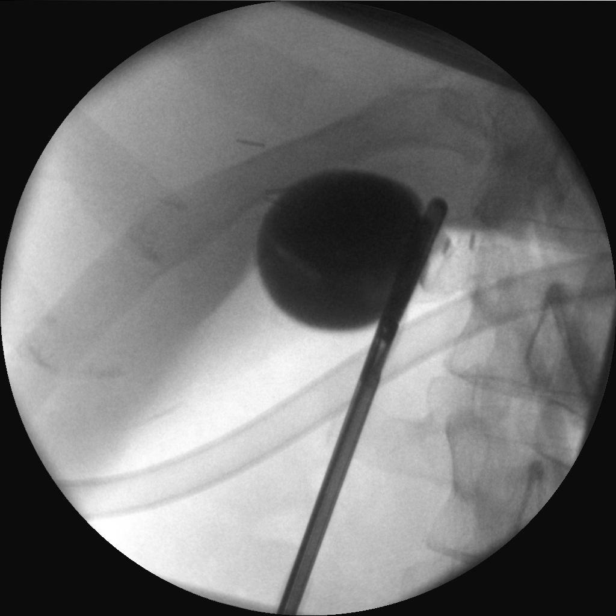
[frame 28/55]
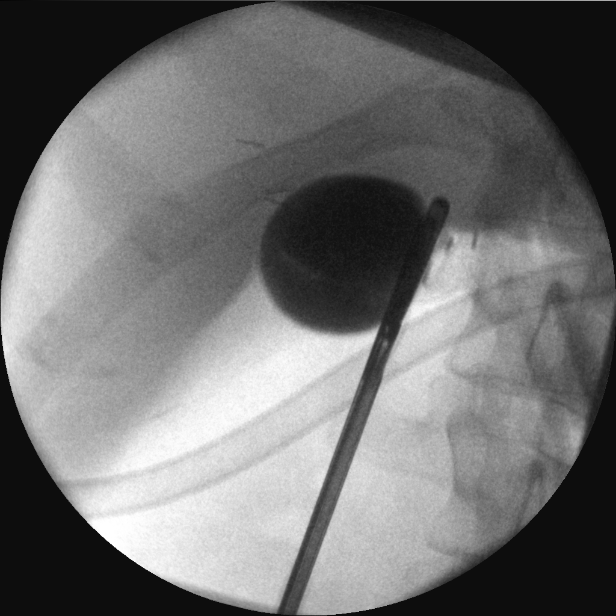
[frame 47/55]
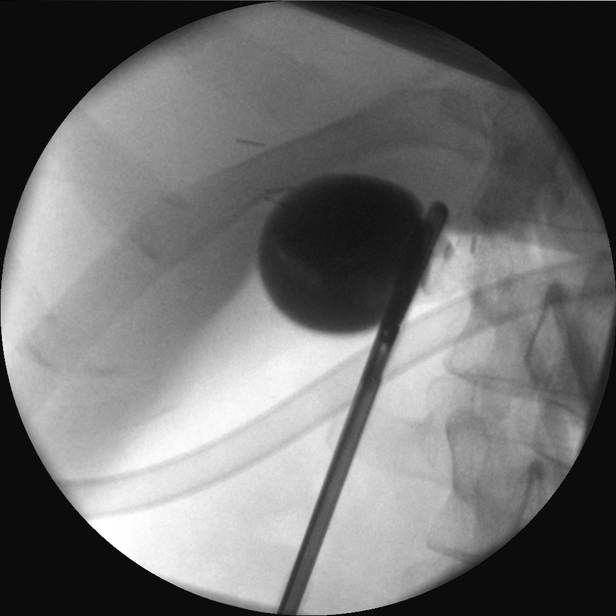
[frame 55/55  full-range]
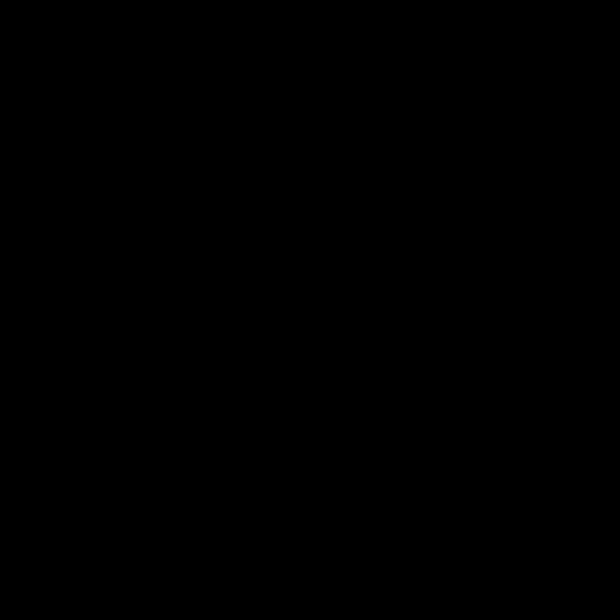

[Series 12: cont. · 3 of 200 frames shown (3 of 4)]
[frame 4/200]
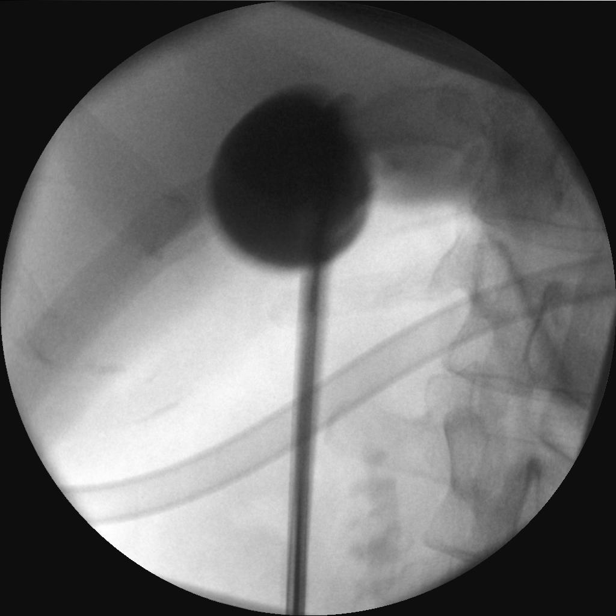
[frame 31/200]
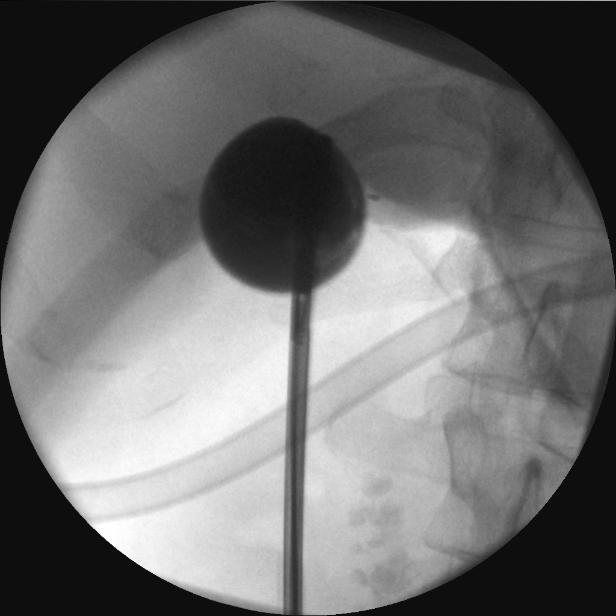
[frame 101/200]
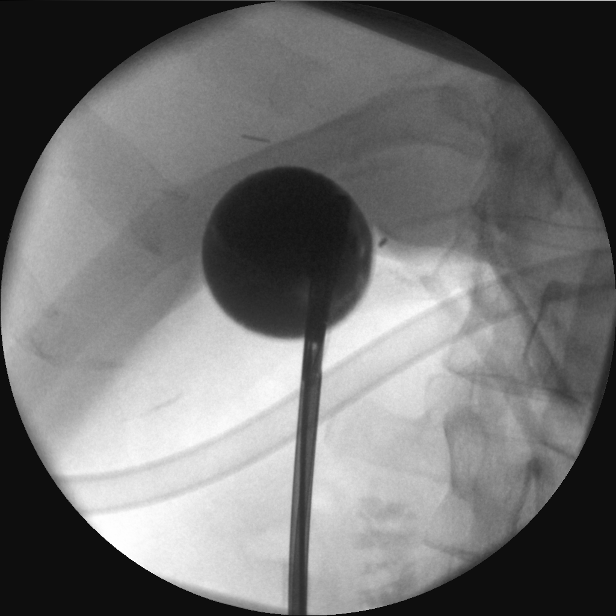

[Series 13: cont. · 4 of 93 frames shown (4 of 4)]
[frame 14/93]
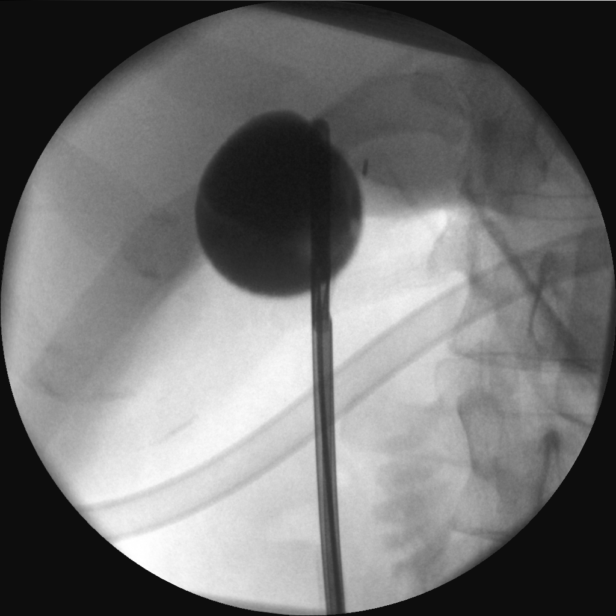
[frame 47/93]
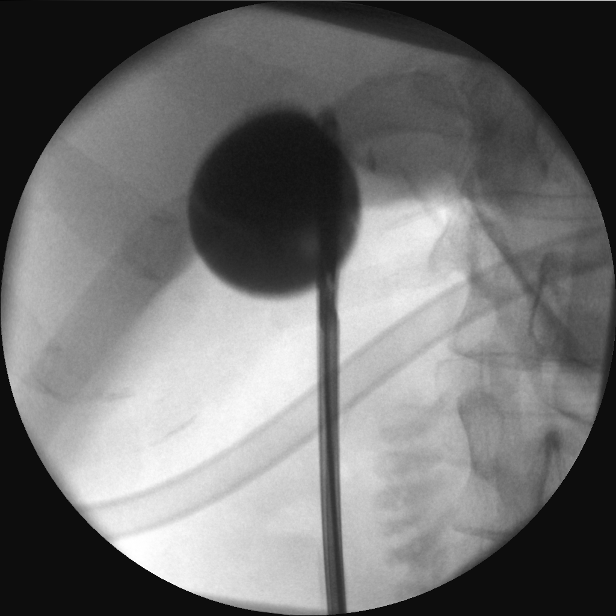
[frame 80/93]
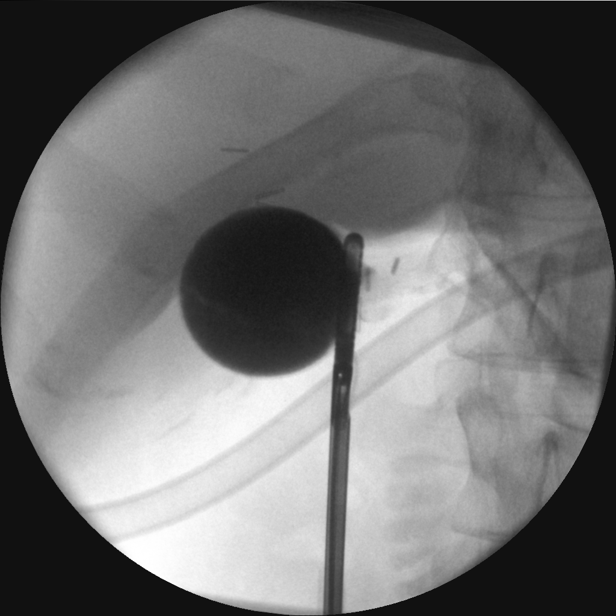
[frame 88/93]
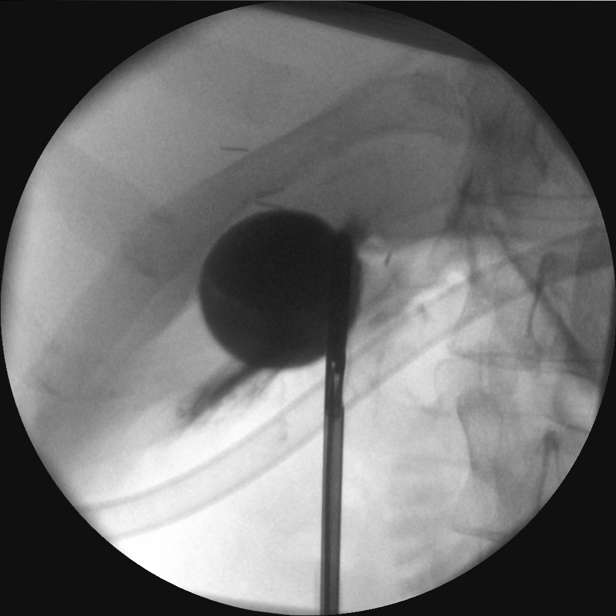

[14 of 16 positions shown; findings below may reference images not displayed]

FINDINGS: Surgical instruments project over the upper abdomen.

There is cannulation of the cystic duct/gallbladder neck, with
antegrade infusion of small volume contrast, partially opacifying
the extrahepatic biliary ductal system.

Contrast reaches the duodenum which is partially opacified.
IMPRESSION: Intraoperative cholangiogram demonstrates small volume transient
contrast within the extrahepatic ductal system. Although contrast
does reach the duodenum, the study cannot exclude
choledocholithiasis.

Please refer to the dictated operative report for full details of
intraoperative findings and procedure

## 2021-07-21 NOTE — Anesthesia Postprocedure Evaluation (Signed)
Anesthesia Post Note  Patient: Jerry Johns  Procedure(s) Performed: LAPAROSCOPIC CHOLECYSTECTOMY  Patient location during evaluation: PACU Anesthesia Type: General Level of consciousness: awake and awake and alert Pain management: pain level controlled Vital Signs Assessment: post-procedure vital signs reviewed and stable Respiratory status: spontaneous breathing Cardiovascular status: blood pressure returned to baseline Postop Assessment: no headache Anesthetic complications: no   No notable events documented.   Last Vitals:  Vitals:   09/27/17 2022 09/28/17 0450  BP: 115/66 109/67  Pulse: 82 81  Resp: 16 16  Temp: 36.9 C 37 C  SpO2: 94% 95%    Last Pain:  Vitals:   09/28/17 0809  TempSrc:   PainSc: 2                  VAN STAVEREN,Journee Bobrowski
